# Patient Record
Sex: Female | Born: 1960 | Race: White | Hispanic: No | Smoking: Current every day smoker
Health system: Southern US, Community
[De-identification: ages and names within clinical notes are randomized; demographics above are authoritative.]

## PROBLEM LIST (undated history)

## (undated) DIAGNOSIS — F32A Depression, unspecified: Secondary | ICD-10-CM

## (undated) DIAGNOSIS — J449 Chronic obstructive pulmonary disease, unspecified: Secondary | ICD-10-CM

## (undated) DIAGNOSIS — F419 Anxiety disorder, unspecified: Secondary | ICD-10-CM

## (undated) DIAGNOSIS — M4800 Spinal stenosis, site unspecified: Secondary | ICD-10-CM

## (undated) DIAGNOSIS — M549 Dorsalgia, unspecified: Secondary | ICD-10-CM

## (undated) DIAGNOSIS — F329 Major depressive disorder, single episode, unspecified: Secondary | ICD-10-CM

## (undated) DIAGNOSIS — E782 Mixed hyperlipidemia: Secondary | ICD-10-CM

## (undated) DIAGNOSIS — K219 Gastro-esophageal reflux disease without esophagitis: Secondary | ICD-10-CM

## (undated) HISTORY — PX: DILATION AND CURETTAGE OF UTERUS: SHX78

## (undated) HISTORY — DX: Anxiety disorder, unspecified: F41.9

## (undated) HISTORY — PX: CHOLECYSTECTOMY: SHX55

## (undated) HISTORY — DX: Chronic obstructive pulmonary disease, unspecified: J44.9

## (undated) HISTORY — DX: Spinal stenosis, site unspecified: M48.00

## (undated) HISTORY — DX: Mixed hyperlipidemia: E78.2

## (undated) HISTORY — PX: TUBAL LIGATION: SHX77

---
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
## 2015-10-09 ENCOUNTER — Other Ambulatory Visit (HOSPITAL_COMMUNITY): Payer: Self-pay | Admitting: Adult Health Nurse Practitioner

## 2015-10-09 ENCOUNTER — Ambulatory Visit (HOSPITAL_COMMUNITY)
Admission: RE | Admit: 2015-10-09 | Discharge: 2015-10-09 | Disposition: A | Discharge status: Home / Self Care | Payer: 59 | Source: Ambulatory Visit | Attending: Adult Health Nurse Practitioner | Admitting: Adult Health Nurse Practitioner

## 2015-10-09 DIAGNOSIS — M5441 Lumbago with sciatica, right side: Secondary | ICD-10-CM

## 2015-10-09 DIAGNOSIS — M5442 Lumbago with sciatica, left side: Principal | ICD-10-CM

## 2015-10-19 ENCOUNTER — Other Ambulatory Visit (HOSPITAL_COMMUNITY): Payer: Self-pay | Admitting: Adult Health Nurse Practitioner

## 2015-10-19 DIAGNOSIS — M4310 Spondylolisthesis, site unspecified: Secondary | ICD-10-CM

## 2015-11-01 ENCOUNTER — Ambulatory Visit (HOSPITAL_COMMUNITY)
Admission: RE | Admit: 2015-11-01 | Discharge: 2015-11-01 | Disposition: A | Discharge status: Home / Self Care | Payer: 59 | Source: Ambulatory Visit | Attending: Adult Health Nurse Practitioner | Admitting: Adult Health Nurse Practitioner

## 2015-11-01 DIAGNOSIS — M4807 Spinal stenosis, lumbosacral region: Secondary | ICD-10-CM | POA: Diagnosis not present

## 2015-11-01 DIAGNOSIS — M4310 Spondylolisthesis, site unspecified: Secondary | ICD-10-CM | POA: Diagnosis not present

## 2015-11-01 DIAGNOSIS — M5126 Other intervertebral disc displacement, lumbar region: Secondary | ICD-10-CM | POA: Insufficient documentation

## 2015-11-01 DIAGNOSIS — M5127 Other intervertebral disc displacement, lumbosacral region: Secondary | ICD-10-CM | POA: Diagnosis not present

## 2015-11-28 ENCOUNTER — Ambulatory Visit: Payer: BLUE CROSS/BLUE SHIELD | Attending: Adult Health Nurse Practitioner | Admitting: Physical Therapy

## 2015-11-28 DIAGNOSIS — M5442 Lumbago with sciatica, left side: Secondary | ICD-10-CM | POA: Diagnosis present

## 2015-11-28 DIAGNOSIS — M5441 Lumbago with sciatica, right side: Secondary | ICD-10-CM | POA: Insufficient documentation

## 2015-11-28 NOTE — Therapy (Signed)
[facility] [place], [place], [address] Phone: [phone]   Fax:  [phone]  Physical Therapy Treatment  Patient Details  Name: Lydia Alexander MRN: [id number] Date of Birth: 1961-09-25 Referring Provider: Lars Mage MD.  Encounter Date: 11/28/2015      PT End of Session - 11/28/15 1232    Visit Number 1   Number of Visits 12   Date for PT Re-Evaluation 01/09/16   PT Start Time 0947   PT Stop Time 1033   PT Time Calculation (min) 46 min   Activity Tolerance Patient tolerated treatment well   Behavior During Therapy [facility] for tasks assessed/performed      No past medical history on file.  No past surgical history on file.  There were no vitals filed for this visit.  Visit Diagnosis:  Bilateral low back pain with sciatica, sciatica laterality unspecified - Plan: PT plan of care cert/re-cert, PT plan of care cert/re-cert      Subjective Assessment - 11/28/15 1244    Subjective Began getting right leg pain about a month ago and is now on the left side also.   Limitations Sitting;Standing   How long can you sit comfortably? 10-15 minutes.   How long can you stand comfortably? 10-15 minutes.   Patient Stated Goals Get rid of my leg pain.   Currently in Pain? Yes   Pain Score 6    Pain Location Leg   Pain Orientation Right;Left   Pain Descriptors / Indicators Aching;Throbbing   Pain Onset More than a month ago   Pain Frequency Constant   Aggravating Factors  Sitting, standing and work activities.   Pain Relieving Factors 'Nothing."            [facility] PT Assessment - 11/28/15 0001    Assessment   Medical Diagnosis Lumbar disc bulge.   Referring Provider Lars Mage MD.   Onset Date/Surgical Date --  One month.   Precautions   Precautions None   Restrictions   Weight Bearing Restrictions No   Balance Screen   Has the patient fallen in the past 6 months No   Has the patient had a decrease  in activity level because of a fear of falling?  Yes   Is the patient reluctant to leave their home because of a fear of falling?  No   Home [employer] residence   Prior Function   Level of Independence Independent   Posture/Postural Control   Posture/Postural Control No significant limitations   ROM / Strength   AROM / PROM / Strength AROM;Strength   AROM   Overall AROM Comments Active lumbar flexion limited by 80% and lumbar extenson= 0 degrees.   Strength   Overall Strength Comments Normal LE strength.   Palpation   Palpation comment No palpable low back pain.   Special Tests    Special Tests --  (+) bil SLR's.  0+/4+ bil LE DTR's.   Ambulation/Gait   Gait Comments Transitory movements such as sit to stand and supine to sit are quite painful.  The patient is ambulating in some spinal flexion due to pain.                     [facility] Adult PT Treatment/Exercise - 11/28/15 0001    Modalities   Modalities Electrical Stimulation;Moist Heat   Moist Heat Therapy   Number Minutes Moist Heat 15 Minutes   Electrical Stimulation   Electrical Stimulation  Location --  Lumbar spine.   Electrical Stimulation Action IFC   Electrical Stimulation Parameters 80-150 HZ   Electrical Stimulation Goals Pain                PT Education - 11/28/15 1232    Education provided Yes   Education Details Patient instructed in draw-in exercise and standing extension.   Person(s) Educated Patient   Methods Handout   Comprehension Verbalized understanding;Need further instruction             PT Long Term Goals - 11/28/15 1309    PT LONG TERM GOAL #1   Title Ind with HEP.   Time 6   Period Weeks   Status New   PT LONG TERM GOAL #2   Title Sit 30 minutes with pain not > 3/10.   PT LONG TERM GOAL #3   Title Stand 30 minutes wiht pain not > 3/10.   PT LONG TERM GOAL #4   Title Perform work activiities with pain not > 3/10.   Time 6   Period  Weeks   Status New   PT LONG TERM GOAL #5   Title Eliminate LE pain.   Time 6   Period Weeks   Status New               Plan - 11/28/15 1234    Clinical Impression Statement The patient reports last September after a long drive to [place] she began to experienence low back pain.  She also works 2 jobs and stands and is required to do a lot of bending.  She reports her pain began to radiate into her left buttock and down the back of her left leg to the level of her knee approximately one month ago.  She states she has overcompensated by weight shifting to her right side and is noe experiencing pain down her right LE.  Her pain at rest today is a 5-6/10 but she has had several occasions in which her pain has risen to 10/10 with prolonged standing, sitting and working activities. An MRI revealed multi-level disc bulges.   Pt will benefit from skilled therapeutic intervention in order to improve on the following deficits Pain;Decreased range of motion;Decreased activity tolerance   Rehab Potential Good   PT Frequency 2x / week   PT Duration 6 weeks   PT Treatment/Interventions ADLs/Self Care Home Management;Electrical Stimulation;Moist Heat;Therapeutic exercise;Therapeutic activities;Patient/family education;Manual techniques;Traction   PT Next Visit Plan Can begin int lumbar traction at 40% body weight.  Review draw-in exercise and standing extension.  Progress with core exercises.  Moist heat and electrical stimulation to lower back region.   Consulted and Agree with Plan of Care Patient        Problem List There are no active problems to display for this patient.   Kelleen Stolze, [place] MPT 11/28/2015, 1:32 PM  [facility] [address] [place], [place], [address] Phone: [phone]   Fax:  [phone]  Name: Griselda Bramblett MRN: [id number] Date of Birth: [birth year]

## 2015-12-04 ENCOUNTER — Ambulatory Visit: Payer: BLUE CROSS/BLUE SHIELD | Admitting: Physical Therapy

## 2015-12-06 ENCOUNTER — Ambulatory Visit: Payer: BLUE CROSS/BLUE SHIELD | Admitting: Physical Therapy

## 2015-12-06 DIAGNOSIS — M5441 Lumbago with sciatica, right side: Secondary | ICD-10-CM

## 2015-12-06 DIAGNOSIS — M5442 Lumbago with sciatica, left side: Principal | ICD-10-CM

## 2015-12-06 NOTE — Patient Instructions (Signed)
Pelvic Tilt: Posterior - Legs Bent (Supine)   Tighten stomach and flatten back by rolling pelvis down. Hold _10___ seconds. Relax. Repeat _10-30___ times per set. Do __2__ sets per session. Do _2___ sessions per day.   . Straight Leg Raise   Tighten stomach and slowly raise locked right leg __4__ inches from floor. Repeat __10-30__ times per set. Do __2__ sets per session. Do __2__ sessions per day.    Self-Mobilization: Heel Slide (Supine)   Bent Leg Lift (Hook-Lying)   Tighten stomach and slowly raise right leg _5___ inches from floor. Keep trunk rigid. Hold _3___ seconds. Repeat _10___ times per set. Do ___2-3_ sets per session. Do __2__ sessions per day.

## 2015-12-06 NOTE — Therapy (Signed)
[facility] [place], [place], [address] Phone: [phone]   Fax:  [phone]  Physical Therapy Treatment  Patient Details  Name: Lydia Alexander MRN: [id number] Date of Birth: 1961-07-26 Referring Provider: Lars Mage MD.  Encounter Date: 12/06/2015      PT End of Session - 12/06/15 0959    Visit Number 2   Number of Visits 12   Date for PT Re-Evaluation 01/09/16   PT Start Time 0915   PT Stop Time 0959   PT Time Calculation (min) 44 min   Activity Tolerance Patient tolerated treatment well   Behavior During Therapy [facility] for tasks assessed/performed      No past medical history on file.  No past surgical history on file.  There were no vitals filed for this visit.  Visit Diagnosis:  Bilateral low back pain with sciatica, sciatica laterality unspecified      Subjective Assessment - 12/06/15 0944    Subjective 8/10 pain left low back and down left LE today   Limitations Sitting;Standing   How long can you sit comfortably? 10-15 minutes.   How long can you stand comfortably? 10-15 minutes.   Patient Stated Goals Get rid of my leg pain.   Currently in Pain? Yes   Pain Score 8    Pain Location Back   Pain Orientation Left;Right   Pain Descriptors / Indicators Aching;Burning   Pain Type Acute pain   Pain Onset More than a month ago   Pain Frequency Constant   Aggravating Factors  increased activity in any one position   Pain Relieving Factors nothing at this time                          [facility] Adult PT Treatment/Exercise - 12/06/15 0001    Exercises   Exercises Lumbar   Lumbar Exercises: Supine   Ab Set --  draw ins 2x10 with holds   Bent Knee Raise --  draw ins 2x10 with holds   Straight Leg Raise --  draw ins 2x10 with holds   Modalities   Modalities Traction   Moist Heat Therapy   Number Minutes Moist Heat 15 Minutes   Electrical Stimulation   Electrical Stimulation  Location back   Electrical Stimulation Action IFC   Electrical Stimulation Parameters 80-150hz    Electrical Stimulation Goals Pain   Traction   Type of Traction Lumbar   Min (lbs) 5   Max (lbs) 84   Hold Time 99   Rest Time 5   Time 15                PT Education - 12/06/15 0947    Education provided Yes   Education Details HEP   Person(s) Educated Patient   Methods Explanation;Demonstration;Handout   Comprehension Verbalized understanding;Returned demonstration             PT Long Term Goals - 12/06/15 1008    PT LONG TERM GOAL #1   Title Ind with HEP.   Time 6   Period Weeks   Status On-going   PT LONG TERM GOAL #2   Title Sit 30 minutes with pain not > 3/10.   Time 6   Period Weeks   Status On-going   PT LONG TERM GOAL #3   Title Stand 30 minutes wiht pain not > 3/10.   Time 6   Period Weeks   Status On-going   PT LONG TERM GOAL #  4   Title Perform work activiities with pain not > 3/10.   Time 6   Period Weeks   Status On-going   PT LONG TERM GOAL #5   Title Eliminate LE pain.   Time 6   Period Weeks   Status On-going               Plan - 12/06/15 1001    Clinical Impression Statement Patient tolerated treatment with no pain increase. Patient started traction (40% traction at 84#) per MPT, Patient was given HEP for beginning draw in core exercises with good understanding. Patient unable to meet LTG's due to pain deficits.   Pt will benefit from skilled therapeutic intervention in order to improve on the following deficits Pain;Decreased range of motion;Decreased activity tolerance   Rehab Potential Good   Clinical Impairments Affecting Rehab Potential Patient weight 210#    PT Frequency 2x / week   PT Duration 6 weeks   PT Treatment/Interventions ADLs/Self Care Home Management;Electrical Stimulation;Moist Heat;Therapeutic exercise;Therapeutic activities;Patient/family education;Manual techniques;Traction   PT Next Visit Plan cont with  POC per MPT- traction, standing ext and core exercises per patient tolerance   Consulted and Agree with Plan of Care Patient        Problem List There are no active problems to display for this patient.   Phillips Climes, PTA 12/06/2015, 10:17 AM  [facility] [place], [place], [address] Phone: [phone]   Fax:  [phone]  Name: Lydia Alexander MRN: [id number] Date of Birth: March 09, 1961

## 2015-12-11 ENCOUNTER — Ambulatory Visit: Payer: BLUE CROSS/BLUE SHIELD | Admitting: Physical Therapy

## 2015-12-11 DIAGNOSIS — M5441 Lumbago with sciatica, right side: Secondary | ICD-10-CM | POA: Diagnosis not present

## 2015-12-11 DIAGNOSIS — M5442 Lumbago with sciatica, left side: Principal | ICD-10-CM

## 2015-12-11 NOTE — Therapy (Signed)
[facility] [place], [place], [address] Phone: [phone]   Fax:  [phone]  Physical Therapy Treatment  Patient Details  Name: Lydia Alexander MRN: [id number] Date of Birth: February 01, 1961 Referring Provider: Lars Mage MD.  Encounter Date: 12/11/2015      PT End of Session - 12/11/15 1348    Visit Number 3   Number of Visits 12   Date for PT Re-Evaluation 01/09/16   PT Start Time 6948   PT Stop Time 1436   PT Time Calculation (min) 48 min   Activity Tolerance Patient tolerated treatment well   Behavior During Therapy [facility] for tasks assessed/performed      No past medical history on file.  No past surgical history on file.  There were no vitals filed for this visit.  Visit Diagnosis:  Bilateral low back pain with sciatica, sciatica laterality unspecified      Subjective Assessment - 12/11/15 1347    Subjective Reports that she just got off work and states that she took her flexeril secondary to leg tightness.   Limitations Sitting;Standing   How long can you sit comfortably? 10-15 minutes.   How long can you stand comfortably? 10-15 minutes.   Patient Stated Goals Get rid of my leg pain.   Currently in Pain? Yes   Pain Score 5    Pain Location Leg   Pain Orientation Right;Left   Pain Descriptors / Indicators Tightness   Pain Type Acute pain   Pain Onset More than a month ago            [facility] PT Assessment - 12/11/15 0001    Assessment   Medical Diagnosis Lumbar disc bulge.   Next MD Visit none scheduled   Precautions   Precautions None                     [facility] Adult PT Treatment/Exercise - 12/11/15 0001    Lumbar Exercises: Standing   Other Standing Lumbar Exercises Rockerboard x3 min for calf tightness   Lumbar Exercises: Supine   Ab Set 20 reps;5 seconds   Clam 20 reps   Heel Slides Other (comment);10 reps  RLE shooting pain noted; "like a rubberband"   Bent Knee Raise  20 reps;Other (comment)  BLE   Bridge 20 reps;Other (comment)  "a little bit but not too bad" pain   Straight Leg Raise 20 reps;Other (comment)  BLE; Reported "a little" RLE pain   Other Supine Lumbar Exercises B supine leg press with core activation x20 reps   B thigh tingling experienced   Modalities   Modalities Traction   Traction   Type of Traction Lumbar   Min (lbs) 5   Max (lbs) 84   Hold Time 99   Rest Time 5   Time 15                     PT Long Term Goals - 12/06/15 1008    PT LONG TERM GOAL #1   Title Ind with HEP.   Time 6   Period Weeks   Status On-going   PT LONG TERM GOAL #2   Title Sit 30 minutes with pain not > 3/10.   Time 6   Period Weeks   Status On-going   PT LONG TERM GOAL #3   Title Stand 30 minutes wiht pain not > 3/10.   Time 6   Period Weeks   Status On-going  PT LONG TERM GOAL #4   Title Perform work activiities with pain not > 3/10.   Time 6   Period Weeks   Status On-going   PT LONG TERM GOAL #5   Title Eliminate LE pain.   Time 6   Period Weeks   Status On-going               Plan - 12/11/15 1420    Clinical Impression Statement Patient tolerated today's treatment well without increase in low back pain and only had complaints of sensations in B thighs with exercise. Patient unable to meet any goals today secondary to pain experienced and reported inability to complete HEP secondary to busy work schedule as well as continued LE symptoms. Completed all supine core exercises today with core activation direction for core strengthening with only intermittnat reports of sensations such as tingling with B leg press and heel slides limited to RLE only secondary to RLE pain experienced. Patient experiences pain with transitional movements only intermittantly now per patient report but now pain mostly in RLE secondary to compensatory strategies per patient report. Patient also reported increased edema in R foot upon waking and  patient was educated to elevate B feet following work or before bed to prevent edema. Traction continued with max weight at 84# today with normal response upon end of traction session.   Pt will benefit from skilled therapeutic intervention in order to improve on the following deficits Pain;Decreased range of motion;Decreased activity tolerance   Rehab Potential Good   Clinical Impairments Affecting Rehab Potential Patient weight 210#    PT Frequency 2x / week   PT Duration 6 weeks   PT Treatment/Interventions ADLs/Self Care Home Management;Electrical Stimulation;Moist Heat;Therapeutic exercise;Therapeutic activities;Patient/family education;Manual techniques;Traction   PT Next Visit Plan cont with POC per MPT- traction, standing ext and core exercises per patient tolerance   Consulted and Agree with Plan of Care Patient        Problem List There are no active problems to display for this patient.   Wynelle Fanny, PTA 12/11/2015, 3:10 PM  [facility] [address] [place], [place], [address] Phone: [phone]   Fax:  [phone]  Name: Lydia Alexander MRN: [id number] Date of Birth: 1961-07-22

## 2015-12-12 ENCOUNTER — Encounter: Payer: Self-pay | Admitting: Physical Therapy

## 2015-12-12 ENCOUNTER — Ambulatory Visit: Payer: BLUE CROSS/BLUE SHIELD | Admitting: Physical Therapy

## 2015-12-12 DIAGNOSIS — M5441 Lumbago with sciatica, right side: Secondary | ICD-10-CM | POA: Diagnosis not present

## 2015-12-12 DIAGNOSIS — M5442 Lumbago with sciatica, left side: Principal | ICD-10-CM

## 2015-12-12 NOTE — Therapy (Signed)
[facility] [place], [place], [address] Phone: [phone]   Fax:  [phone]  Physical Therapy Treatment  Patient Details  Name: Lydia Alexander MRN: [id number] Date of Birth: 04/17/1961 Referring Provider: Lars Mage MD.  Encounter Date: 12/12/2015      PT End of Session - 12/12/15 1514    Visit Number 4   Number of Visits 12   Date for PT Re-Evaluation 01/09/16   PT Start Time 0814   PT Stop Time 1526   PT Time Calculation (min) 40 min   Activity Tolerance Patient tolerated treatment well   Behavior During Therapy [facility] for tasks assessed/performed      History reviewed. No pertinent past medical history.  History reviewed. No pertinent past surgical history.  There were no vitals filed for this visit.  Visit Diagnosis:  Bilateral low back pain with sciatica, sciatica laterality unspecified      Subjective Assessment - 12/12/15 1451    Subjective some improvement overall yet still LE symptoms   Limitations Sitting;Standing   How long can you sit comfortably? 10-15 minutes.   How long can you stand comfortably? 10-15 minutes.   Patient Stated Goals Get rid of my leg pain.   Currently in Pain? Yes   Pain Score 5    Pain Location Leg   Pain Orientation Right;Left;Lower   Pain Descriptors / Indicators Dull;Aching   Pain Type Acute pain   Pain Onset More than a month ago   Pain Frequency Constant   Aggravating Factors  increased activity in one position    Pain Relieving Factors therapy has helped thus far                         [facility] Adult PT Treatment/Exercise - 12/12/15 0001    Lumbar Exercises: Supine   Ab Set 20 reps;5 seconds   Clam 20 reps   Heel Slides --  2x10    Bent Knee Raise Other (comment);3 seconds  2x10   Bridge 3 seconds;20 reps   Straight Leg Raise 3 seconds  2x10   Other Supine Lumbar Exercises seated for core strength with scap retraction with red tband  2x10   Traction   Type of Traction Lumbar   Min (lbs) 5   Max (lbs) 90   Hold Time 99   Rest Time 5   Time 15                     PT Long Term Goals - 12/06/15 1008    PT LONG TERM GOAL #1   Title Ind with HEP.   Time 6   Period Weeks   Status On-going   PT LONG TERM GOAL #2   Title Sit 30 minutes with pain not > 3/10.   Time 6   Period Weeks   Status On-going   PT LONG TERM GOAL #3   Title Stand 30 minutes wiht pain not > 3/10.   Time 6   Period Weeks   Status On-going   PT LONG TERM GOAL #4   Title Perform work activiities with pain not > 3/10.   Time 6   Period Weeks   Status On-going   PT LONG TERM GOAL #5   Title Eliminate LE pain.   Time 6   Period Weeks   Status On-going               Plan - 12/12/15 1516  Clinical Impression Statement Patient has reported some improvement in symptoms and feels like therapy has helped thus far. patenit has not been doing corestrength at home due to fatigue. today increased lumbar traction to 90# MPT. goals ongoing due to ongoing LE symptoms.   Pt will benefit from skilled therapeutic intervention in order to improve on the following deficits Pain;Decreased range of motion;Decreased activity tolerance   Rehab Potential Good   Clinical Impairments Affecting Rehab Potential Patient weight 210#    PT Frequency 2x / week   PT Duration 6 weeks   PT Treatment/Interventions ADLs/Self Care Home Management;Electrical Stimulation;Moist Heat;Therapeutic exercise;Therapeutic activities;Patient/family education;Manual techniques;Traction   PT Next Visit Plan cont with POC per MPT- traction, standing ext and core exercises per patient tolerance   Consulted and Agree with Plan of Care Patient        Problem List There are no active problems to display for this patient.   Phillips Climes, PTA 12/12/2015, 3:26 PM  [facility] [place], [place],  [address] Phone: [phone]   Fax:  [phone]  Name: Lydia Alexander MRN: [id number] Date of Birth: June 13, 1961

## 2015-12-19 ENCOUNTER — Ambulatory Visit: Payer: BLUE CROSS/BLUE SHIELD | Attending: Adult Health Nurse Practitioner | Admitting: Physical Therapy

## 2015-12-19 DIAGNOSIS — M5441 Lumbago with sciatica, right side: Secondary | ICD-10-CM | POA: Insufficient documentation

## 2015-12-19 DIAGNOSIS — M5442 Lumbago with sciatica, left side: Secondary | ICD-10-CM | POA: Diagnosis present

## 2015-12-19 NOTE — Therapy (Signed)
[facility] [place], [place], [address] Phone: [phone]   Fax:  [phone]  Physical Therapy Treatment  Patient Details  Name: Lydia Alexander MRN: [id number] Date of Birth: Aug 06, 1961 Referring Provider: Lars Mage MD.  Encounter Date: 12/19/2015      PT End of Session - 12/19/15 1446    Visit Number 5   Number of Visits 12   Date for PT Re-Evaluation 01/09/16   PT Start Time 9038   PT Stop Time 1527  2 units secondary to patient being 12 minutes late for appt and traction set up difficulty   PT Time Calculation (min) 44 min   Activity Tolerance Patient tolerated treatment well   Behavior During Therapy [facility] for tasks assessed/performed      No past medical history on file.  No past surgical history on file.  There were no vitals filed for this visit.  Visit Diagnosis:  Bilateral low back pain with sciatica, sciatica laterality unspecified      Subjective Assessment - 12/19/15 1443    Subjective Reports that she is having some improvement but had pain in low back following previous treatment when she got home and was relaxing. Was 5/10 low back pain upon waking this morning. 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   Limitations Sitting;Standing   How long can you sit comfortably? 10-15 minutes.   How long can you stand comfortably? 10-15 minutes.   Patient Stated Goals Get rid of my leg pain.   Currently in Pain? Yes   Pain Score 3    Pain Location Leg   Pain Orientation Right;Left   Pain Descriptors / Indicators Sore;Throbbing   Pain Type Acute pain   Pain Onset More than a month ago            [facility] PT Assessment - 12/19/15 0001    Assessment   Medical Diagnosis Lumbar disc bulge.   Next MD Visit none scheduled   Precautions   Precautions None                     [facility] Adult PT Treatment/Exercise - 12/19/15 0001    Lumbar Exercises: Supine   Ab Set 20 reps;5 seconds    Clam 20 reps;Other (comment)  yellow theraband   Heel Slides 20 reps  BLE   Bent Knee Raise 20 reps   Bridge 20 reps  Experienced pull in B anterior thighs   Straight Leg Raise 20 reps;Other (comment)  BLE   Other Supine Lumbar Exercises Seated B shoulder row, ext with core activation and red therband x20 reps each   Modalities   Modalities Traction   Traction   Type of Traction Lumbar   Min (lbs) 5   Max (lbs) 86   Hold Time 99   Rest Time 5   Time 15                     PT Long Term Goals - 12/06/15 1008    PT LONG TERM GOAL #1   Title Ind with HEP.   Time 6   Period Weeks   Status On-going   PT LONG TERM GOAL #2   Title Sit 30 minutes with pain not > 3/10.   Time 6   Period Weeks   Status On-going   PT LONG TERM GOAL #3   Title Stand 30 minutes wiht pain not > 3/10.   Time 6   Period Weeks  Status On-going   PT LONG TERM GOAL #4   Title Perform work activiities with pain not > 3/10.   Time 6   Period Weeks   Status On-going   PT LONG TERM GOAL #5   Title Eliminate LE pain.   Time 6   Period Weeks   Status On-going               Plan - 12/19/15 1515    Clinical Impression Statement Patient continues to tolerate treatment fairly well today although she continues to experience LE symptoms with R> L. Patient experiences LE symptoms down to approximately calf level but notices RLE symptoms more per patient report. Patient at times notices herself leaning to the R side while at rest and is more conscious of her posture now per patient report. Patient also has experienced decreased R foot swelling per patient report. Completed all therapeutic exercises with VCs for core activation and proper lumbar posure. Traction max weight decreased to 86# per patient experiencing discomfort following previous treatment when she returned home.    Pt will benefit from skilled therapeutic intervention in order to improve on the following deficits  Pain;Decreased range of motion;Decreased activity tolerance   Rehab Potential Good   Clinical Impairments Affecting Rehab Potential Patient weight 210#    PT Frequency 2x / week   PT Duration 6 weeks   PT Treatment/Interventions ADLs/Self Care Home Management;Electrical Stimulation;Moist Heat;Therapeutic exercise;Therapeutic activities;Patient/family education;Manual techniques;Traction   PT Next Visit Plan cont with POC per MPT- traction, standing ext and core exercises per patient tolerance. Assess traction response prior to adjusting max weight again.   Consulted and Agree with Plan of Care Patient        Problem List There are no active problems to display for this patient.   Wynelle Fanny, PTA 12/19/2015, 3:58 PM  [facility] [place], [place], [address] Phone: [phone]   Fax:  [phone]  Name: Lydia Alexander MRN: [id number] Date of Birth: [birth year]

## 2015-12-21 ENCOUNTER — Ambulatory Visit: Payer: BLUE CROSS/BLUE SHIELD | Admitting: Physical Therapy

## 2015-12-21 DIAGNOSIS — M5442 Lumbago with sciatica, left side: Principal | ICD-10-CM

## 2015-12-21 DIAGNOSIS — M5441 Lumbago with sciatica, right side: Secondary | ICD-10-CM | POA: Diagnosis not present

## 2015-12-21 NOTE — Therapy (Signed)
[facility] [place], [place], [address] Phone: [phone]   Fax:  [phone]  Physical Therapy Treatment  Patient Details  Name: Lydia Alexander MRN: [id number] Date of Birth: 24-Nov-1960 Referring Provider: Lars Mage MD.  Encounter Date: 12/21/2015      PT End of Session - 12/21/15 1443    Visit Number 6   Number of Visits 12   Date for PT Re-Evaluation 01/09/16   PT Start Time 1950   PT Stop Time 1533   PT Time Calculation (min) 50 min   Activity Tolerance Patient tolerated treatment well   Behavior During Therapy [facility] for tasks assessed/performed      No past medical history on file.  No past surgical history on file.  There were no vitals filed for this visit.  Visit Diagnosis:  Bilateral low back pain with sciatica, sciatica laterality unspecified      Subjective Assessment - 12/21/15 1442    Subjective States that she is tired from work. Reports pain is now from her knees down compared to before therapy where pain was from her hips down.   Limitations Sitting;Standing   How long can you sit comfortably? 10-15 minutes.   How long can you stand comfortably? 10-15 minutes.   Patient Stated Goals Get rid of my leg pain.   Currently in Pain? Yes   Pain Score 5    Pain Location Leg   Pain Orientation Right;Left   Pain Descriptors / Indicators Throbbing   Pain Type Acute pain   Pain Onset More than a month ago            [facility] PT Assessment - 12/21/15 0001    Assessment   Medical Diagnosis Lumbar disc bulge.   Next MD Visit none scheduled   Precautions   Precautions None                     [facility] Adult PT Treatment/Exercise - 12/21/15 0001    Lumbar Exercises: Standing   Row Strengthening;Both  3x10 reps with Pink XTS   Shoulder Extension Strengthening;Both  3x10 reps with Pink XTS   Lumbar Exercises: Supine   Ab Set 20 reps;5 seconds   Heel Slides 20 reps  BLE   Bent  Knee Raise 20 reps  red theraband   Bridge 20 reps   Straight Leg Raise 20 reps  BLE   Lumbar Exercises: Sidelying   Clam 20 reps  BLE with red theraband   Modalities   Modalities Traction   Traction   Type of Traction Lumbar   Min (lbs) 5   Max (lbs) 86   Hold Time 99   Rest Time 5   Time 15                     PT Long Term Goals - 12/06/15 1008    PT LONG TERM GOAL #1   Title Ind with HEP.   Time 6   Period Weeks   Status On-going   PT LONG TERM GOAL #2   Title Sit 30 minutes with pain not > 3/10.   Time 6   Period Weeks   Status On-going   PT LONG TERM GOAL #3   Title Stand 30 minutes wiht pain not > 3/10.   Time 6   Period Weeks   Status On-going   PT LONG TERM GOAL #4   Title Perform work activiities with pain not > 3/10.  Time 6   Period Weeks   Status On-going   PT LONG TERM GOAL #5   Title Eliminate LE pain.   Time 6   Period Weeks   Status On-going               Plan - 12/21/15 1554    Clinical Impression Statement Patient tolerated today's treatment fairly well although she arrived to today's treatment reporting BLE throbbing but stated she worked last night and went back to work early this morning. With both jobs patient has oppurtunities to sit if needed per patient report. Patient has seen improvement in the fact that pain was previously in B hips down and was unbearable as to now her pain is tolerable from B knees down. Patient completes all exercises with VCs for core activation to improve core strength and lumbar stabilization. Patient experienced RLE numbness with standing shoulder extension with Pink XTS. Traction was completed yet at again at 86# max today. Patient still felt slight discomfort following previous treatment but rested following return home per patient report.   Pt will benefit from skilled therapeutic intervention in order to improve on the following deficits Pain;Decreased range of motion;Decreased activity  tolerance   Rehab Potential Good   Clinical Impairments Affecting Rehab Potential Patient weight 210#    PT Frequency 2x / week   PT Duration 6 weeks   PT Treatment/Interventions ADLs/Self Care Home Management;Electrical Stimulation;Moist Heat;Therapeutic exercise;Therapeutic activities;Patient/family education;Manual techniques;Traction   PT Next Visit Plan cont with POC per MPT- traction, standing ext and core exercises per patient tolerance. Assess traction response prior to adjusting max weight again.   Consulted and Agree with Plan of Care Patient        Problem List There are no active problems to display for this patient.   Wynelle Fanny, PTA 12/21/2015, 4:00 PM  [facility] [address] [place], [place], [address] Phone: [phone]   Fax:  [phone]  Name: Lydia Alexander MRN: [id number] Date of Birth: [birth year]

## 2015-12-26 ENCOUNTER — Ambulatory Visit: Payer: BLUE CROSS/BLUE SHIELD | Admitting: Physical Therapy

## 2015-12-26 DIAGNOSIS — M5441 Lumbago with sciatica, right side: Secondary | ICD-10-CM | POA: Diagnosis not present

## 2015-12-26 DIAGNOSIS — M5442 Lumbago with sciatica, left side: Principal | ICD-10-CM

## 2015-12-26 NOTE — Therapy (Signed)
[facility] [place], [place], [address] Phone: [phone]   Fax:  [phone]  Physical Therapy Treatment  Patient Details  Name: Lydia Alexander MRN: [id number] Date of Birth: 02/24/1961 Referring Provider: Lars Mage MD.  Encounter Date: 12/26/2015      PT End of Session - 12/26/15 1434    Visit Number 7   Number of Visits 12   Date for PT Re-Evaluation 01/09/16   PT Start Time 1435   PT Stop Time 1523   PT Time Calculation (min) 48 min      No past medical history on file.  No past surgical history on file.  There were no vitals filed for this visit.  Visit Diagnosis:  Bilateral low back pain with sciatica, sciatica laterality unspecified      Subjective Assessment - 12/26/15 1432    Subjective Reports that she slept after Thursdays appt from afternoon until the next day. States that she usually has pain following treatments. States that she hasn't had much pain since previous treatment.   Limitations Sitting;Standing   How long can you sit comfortably? 10-15 minutes.   How long can you stand comfortably? 10-15 minutes.   Patient Stated Goals Get rid of my leg pain.   Currently in Pain? Yes   Pain Score 3    Pain Location Leg   Pain Orientation Right;Left   Pain Descriptors / Indicators Throbbing   Pain Type Acute pain   Pain Onset More than a month ago            [facility] PT Assessment - 12/26/15 0001    Assessment   Medical Diagnosis Lumbar disc bulge.   Next MD Visit none scheduled   Precautions   Precautions None                     [facility] Adult PT Treatment/Exercise - 12/26/15 0001    Lumbar Exercises: Standing   Row Strengthening;Both  3x10 reps Pink XTS   Shoulder Extension Strengthening;Both  3x10 reps Pink XTS   Other Standing Lumbar Exercises B hip extension x20 reps each   Lumbar Exercises: Supine   Ab Set 5 seconds;Other (comment)  3x10 reps; LE tingling  following exercise   Heel Slides Other (comment)  3x10 reps  BLE; RLE minimal pain   Bent Knee Raise Other (comment)  BLE 3x10 reps each   Bridge Other (comment)  3x10 reps   Straight Leg Raise Other (comment)  3x10 reps BLE   Lumbar Exercises: Sidelying   Clam Other (comment)  3x10 reps BLE with red therband   Modalities   Modalities Traction   Traction   Type of Traction Lumbar   Min (lbs) 5   Max (lbs) 86   Hold Time 99   Rest Time 5   Time 15                     PT Long Term Goals - 12/06/15 1008    PT LONG TERM GOAL #1   Title Ind with HEP.   Time 6   Period Weeks   Status On-going   PT LONG TERM GOAL #2   Title Sit 30 minutes with pain not > 3/10.   Time 6   Period Weeks   Status On-going   PT LONG TERM GOAL #3   Title Stand 30 minutes wiht pain not > 3/10.   Time 6   Period Weeks   Status  On-going   PT LONG TERM GOAL #4   Title Perform work activiities with pain not > 3/10.   Time 6   Period Weeks   Status On-going   PT LONG TERM GOAL #5   Title Eliminate LE pain.   Time 6   Period Weeks   Status On-going               Plan - 12/26/15 1510    Clinical Impression Statement Patient tolerated today's treatment fairly well although she has seen an approximate 35-40% improvement. Patient experienced decreased LE pain upon arrival today and only experienced minimal tingling or sensation with exercises today. Completed all exercises with VCs for core activation and strengthening. Tolerated standing shoulder rows and extension well today but with initiation of B hip extension patient experienced RLE throbbing. Traction again maintained max at 86# and may be advanced in the future secondary to LE symptom status. Patient did not verbalize any lumbar pain or LE pain if pain was present following today's treatment.   Pt will benefit from skilled therapeutic intervention in order to improve on the following deficits Pain;Decreased range of  motion;Decreased activity tolerance   Rehab Potential Good   Clinical Impairments Affecting Rehab Potential Patient weight 210#    PT Frequency 2x / week   PT Duration 6 weeks   PT Treatment/Interventions ADLs/Self Care Home Management;Electrical Stimulation;Moist Heat;Therapeutic exercise;Therapeutic activities;Patient/family education;Manual techniques;Traction   PT Next Visit Plan cont with POC per MPT- traction, standing ext and core exercises per patient tolerance. Assess traction response prior to adjusting max weight again.   Consulted and Agree with Plan of Care Patient        Problem List There are no active problems to display for this patient.   Wynelle Fanny, PTA 12/26/2015, 3:29 PM  [facility] [place], [place], [address] Phone: [phone]   Fax:  [phone]  Name: Lydia Alexander MRN: [id number] Date of Birth: February 17, 1961

## 2015-12-29 ENCOUNTER — Ambulatory Visit: Payer: BLUE CROSS/BLUE SHIELD | Admitting: Physical Therapy

## 2015-12-29 DIAGNOSIS — M5441 Lumbago with sciatica, right side: Secondary | ICD-10-CM

## 2015-12-29 DIAGNOSIS — M5442 Lumbago with sciatica, left side: Principal | ICD-10-CM

## 2015-12-29 NOTE — Therapy (Signed)
[facility] [place], [place], [address] Phone: [phone]   Fax:  [phone]  Physical Therapy Treatment  Patient Details  Name: Lydia Alexander MRN: [id number] Date of Birth: 1961-07-27 Referring Provider: Lars Mage MD.  Encounter Date: 12/29/2015      PT End of Session - 12/29/15 1049    Visit Number 8   Number of Visits 12   Date for PT Re-Evaluation 01/09/16   PT Start Time 1039   PT Stop Time 1137   PT Time Calculation (min) 58 min   Activity Tolerance Patient tolerated treatment well   Behavior During Therapy [facility] for tasks assessed/performed      No past medical history on file.  No past surgical history on file.  There were no vitals filed for this visit.  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   Limitations Sitting;Standing   How long can you sit comfortably? 10-15 minutes.   How long can you stand comfortably? 10-15 minutes.   Patient Stated Goals Get rid of my leg pain.   Currently in Pain? Yes   Pain Score 4    Pain Orientation Left   Pain Descriptors / Indicators Throbbing   Pain Type Acute pain   Pain Onset More than a month ago   Pain Frequency Constant   Aggravating Factors  increased activity                         [facility] Adult PT Treatment/Exercise - 12/29/15 0001    Lumbar Exercises: Aerobic   Stationary Bike Nustep L 4 x 10   Lumbar Exercises: Prone   Straight Leg Raise 20 reps   Opposite Arm/Leg Raise 20 reps   Other Prone Lumbar Exercises POE abolishes pain, but starts numbness/tingling   Other Prone Lumbar Exercises prone lying gets rid of NT and   Traction   Type of Traction Lumbar   Min (lbs) 5   Max  (lbs) 86   Hold Time 99   Rest Time 5   Time 15                PT Education - 12/29/15 1218    Education provided Yes   Education Details HEP   Person(s) Educated Patient   Methods Explanation;Demonstration;Handout   Comprehension Verbalized understanding;Returned demonstration             PT Long Term Goals - 12/06/15 1008    PT LONG TERM GOAL #1   Title Ind with HEP.   Time 6   Period Weeks   Status On-going   PT LONG TERM GOAL #2   Title Sit 30 minutes with pain not > 3/10.   Time 6   Period Weeks   Status On-going   PT LONG TERM GOAL #3   Title Stand 30 minutes wiht pain not > 3/10.   Time 6   Period Weeks   Status On-going   PT LONG TERM GOAL #4   Title Perform work activiities with pain not > 3/10.   Time 6   Period Weeks   Status On-going   PT LONG TERM GOAL #5   Title Eliminate LE pain.   Time 6  Period Weeks   Status On-going               Plan - 12/29/15 1219    Clinical Impression Statement Patient reported increased pain at start of treatment but was able to abolish pain with prone lying. Patient encouraged to lie prone every two hours x 5-10 min when possible to try to centralize sx. She had some pain with isolated Multifidus strengthening in prone, but no pain with alt opp arm and leg. She responded normally to lumbar traction. No goals met at this time.   PT Next Visit Plan Assess traction. Cont core strengthening and progressing prone extension as tolerated.   PT Home Exercise Plan prone opp arm/leg; prone lying   Consulted and Agree with Plan of Care Patient        Problem List There are no active problems to display for this patient.   Madelyn Flavors PT  12/29/2015, 12:29 PM  [facility] [place], [place], [address] Phone: [phone]   Fax:  [phone]  Name: Lydia Alexander MRN: [id number] Date of Birth: Nov 22, 1960

## 2016-01-02 ENCOUNTER — Encounter: Payer: BLUE CROSS/BLUE SHIELD | Admitting: Physical Therapy

## 2016-01-04 ENCOUNTER — Encounter: Payer: Self-pay | Admitting: Physical Therapy

## 2016-01-04 ENCOUNTER — Ambulatory Visit: Payer: BLUE CROSS/BLUE SHIELD | Admitting: Physical Therapy

## 2016-01-04 DIAGNOSIS — M5441 Lumbago with sciatica, right side: Secondary | ICD-10-CM | POA: Diagnosis not present

## 2016-01-04 DIAGNOSIS — M5442 Lumbago with sciatica, left side: Principal | ICD-10-CM

## 2016-01-04 NOTE — Therapy (Signed)
[facility] [place], [place], [address] Phone: [phone]   Fax:  [phone]  Physical Therapy Treatment  Patient Details  Name: Lydia Alexander MRN: [id number] Date of Birth: 1961-01-08 Referring Provider: Lars Mage MD.  Encounter Date: 01/04/2016      PT End of Session - 01/04/16 1449    Visit Number 9   Number of Visits 12   Date for PT Re-Evaluation 01/09/16   PT Start Time 1440   PT Stop Time 1525   PT Time Calculation (min) 45 min   Activity Tolerance Patient tolerated treatment well   Behavior During Therapy [facility] for tasks assessed/performed      History reviewed. No pertinent past medical history.  History reviewed. No pertinent past surgical history.  There were no vitals filed for this visit.  Visit Diagnosis:  Bilateral low back pain with sciatica, sciatica laterality unspecified      Subjective Assessment - 01/04/16 1444    Subjective Patient feeling a little sick from her medication from abscess tooth   Limitations Sitting;Standing   How long can you sit comfortably? 10-15 minutes.   How long can you stand comfortably? 10-15 minutes.   Patient Stated Goals Get rid of my leg pain.   Currently in Pain? Yes   Pain Score 4    Pain Location Leg   Pain Orientation Left   Pain Descriptors / Indicators Throbbing   Pain Type Acute pain   Pain Onset More than a month ago   Pain Frequency Intermittent   Aggravating Factors  work or increased activity   Pain Relieving Factors rest and therapy                         [facility] Adult PT Treatment/Exercise - 01/04/16 0001    Lumbar Exercises: Aerobic   Stationary Bike Nustep L 4 x 10, posture and core activation focus   Lumbar Exercises: Standing   Row Strengthening;Both  draw in focus with pink XTS 2x10   Shoulder Extension Strengthening;Both  draw in focus 2x10 with pink XTS   Lumbar Exercises: Supine   Ab Set 20 reps  10 sec  holds   Bent Knee Raise 3 seconds  2x10 each LE   Bridge 3 seconds  3x10   Straight Leg Raise 3 seconds  2x10 each LE   Traction   Type of Traction Lumbar   Min (lbs) 5   Max (lbs) 86   Hold Time 99   Rest Time 5   Time 15                     PT Long Term Goals - 01/04/16 1500    PT LONG TERM GOAL #1   Title Ind with HEP.   Time 6   Period Weeks   Status Achieved  01/04/16   PT LONG TERM GOAL #2   Title Sit 30 minutes with pain not > 3/10.   Time 6   Period Weeks   Status On-going  5-6/10 (01/04/16/)   PT LONG TERM GOAL #3   Title Stand 30 minutes wiht pain not > 3/10.   Time 6   Period Weeks   Status On-going   PT LONG TERM GOAL #4   Title Perform work activiities with pain not > 3/10.   Time 6   Period Weeks   Status On-going  4/10 (01/04/16)   PT LONG TERM GOAL #5  Title Eliminate LE pain.   Time 6   Period Weeks   Status On-going  reports 45% LE symptoms remain (01/04/16)               Plan - 01/04/16 1508    Clinical Impression Statement Patient has continued to progress and respond well to therapy. Patient reported feling 70% better overall. Patient is able to tolerate sitting and standing for longer with less pain yet not at goal status at this time. Patient has 45% remaining symptoms in LE patient is independent with HEP's and met LTG #1 other goals ongoing due to pain and LE symptoms.    Pt will benefit from skilled therapeutic intervention in order to improve on the following deficits Pain;Decreased range of motion;Decreased activity tolerance   Rehab Potential Good   Clinical Impairments Affecting Rehab Potential Patient weight 210#    PT Frequency 2x / week   PT Duration 6 weeks   PT Treatment/Interventions ADLs/Self Care Home Management;Electrical Stimulation;Moist Heat;Therapeutic exercise;Therapeutic activities;Patient/family education;Manual techniques;Traction   PT Next Visit Plan cont with traction at same level and core  strengthening and progressing prone extension as tolerated.   Consulted and Agree with Plan of Care Patient        Problem List There are no active problems to display for this patient.   Phillips Climes, PTA 01/04/2016, 3:26 PM  [facility] [place], [place], [address] Phone: [phone]   Fax:  [phone]  Name: Lydia Alexander MRN: [id number] Date of Birth: 07-19-1961

## 2016-01-09 ENCOUNTER — Ambulatory Visit: Payer: BLUE CROSS/BLUE SHIELD | Admitting: Physical Therapy

## 2016-01-09 VITALS — BP 129/84 | HR 93

## 2016-01-09 DIAGNOSIS — M5442 Lumbago with sciatica, left side: Principal | ICD-10-CM

## 2016-01-09 DIAGNOSIS — M5441 Lumbago with sciatica, right side: Secondary | ICD-10-CM | POA: Diagnosis not present

## 2016-01-09 NOTE — Therapy (Signed)
[facility] [place], [place], [address] Phone: [phone]   Fax:  [phone]  Physical Therapy Treatment  Patient Details  Name: Lydia Alexander MRN: [id number] Date of Birth: 27-Sep-1961 Referring Provider: Lars Mage MD.  Encounter Date: 01/09/2016      PT End of Session - 01/09/16 1510    Visit Number 10   Number of Visits 12   Date for PT Re-Evaluation 01/09/16   PT Start Time 1518   PT Stop Time 1609   PT Time Calculation (min) 51 min   Activity Tolerance Patient tolerated treatment well   Behavior During Therapy [facility] for tasks assessed/performed      No past medical history on file.  No past surgical history on file.  Filed Vitals:   01/09/16 1518  BP: 129/84  Pulse: 93    Visit Diagnosis:  Bilateral low back pain with sciatica, sciatica laterality unspecified      Subjective Assessment - 01/09/16 1518    Subjective States that her back is hurting her today and has had to work 5 days straight. States that pain is mostly in calves. Requested BP taken today secondary to diastolic BP taken yesterday was high following recent stress at home and not sleeping well last night due to the stress.   Limitations Sitting;Standing   How long can you sit comfortably? 10-15 minutes.   How long can you stand comfortably? 10-15 minutes.   Patient Stated Goals Get rid of my leg pain.   Currently in Pain? Yes   Pain Score 5    Pain Location Leg   Pain Orientation Left;Right   Pain Descriptors / Indicators Throbbing   Pain Type Acute pain   Pain Onset More than a month ago            [facility] PT Assessment - 01/09/16 0001    Assessment   Medical Diagnosis Lumbar disc bulge.   Next MD Visit none scheduled   Precautions   Precautions None                     [facility] Adult PT Treatment/Exercise - 01/09/16 0001    Lumbar Exercises: Stretches   Passive Hamstring Stretch 3 reps;30 seconds  RLE   Lumbar Exercises: Aerobic   Stationary Bike NuStep L5 x12 min with core activation VCs   Lumbar Exercises: Supine   Bridge 20 reps   Lumbar Exercises: Prone   Other Prone Lumbar Exercises POE x3 min; diminished pain but experienced tingling   Lumbar Exercises: Quadruped   Straight Leg Raise 15 reps;Other (comment)  BLE   Opposite Arm/Leg Raise Right arm/Left leg;Left arm/Right leg;15 reps   Modalities   Modalities Traction   Traction   Type of Traction Lumbar   Min (lbs) 5   Max (lbs) 86   Hold Time 99   Rest Time 5   Time 15                     PT Long Term Goals - 01/04/16 1500    PT LONG TERM GOAL #1   Title Ind with HEP.   Time 6   Period Weeks   Status Achieved  01/04/16   PT LONG TERM GOAL #2   Title Sit 30 minutes with pain not > 3/10.   Time 6   Period Weeks   Status On-going  5-6/10 (01/04/16/)   PT LONG TERM GOAL #3   Title Stand 30 minutes  wiht pain not > 3/10.   Time 6   Period Weeks   Status On-going   PT LONG TERM GOAL #4   Title Perform work activiities with pain not > 3/10.   Time 6   Period Weeks   Status On-going  4/10 (01/04/16)   PT LONG TERM GOAL #5   Title Eliminate LE pain.   Time 6   Period Weeks   Status On-going  reports 45% LE symptoms remain (01/04/16)               Plan - 01/09/16 1637    Clinical Impression Statement Patient tolerated today's treatment fairly well today although she arrived with increased BLE pain which she attributed to fatigue from work and recent stress. Patient experienced diminished low back pain with POE exercise although she still experienced LE tingling. Patient was educated regarding release phenomenon with nerve pain and how that may be what she is experiencing. Patient demonstrated instability with quadruped activities. Patient reported experiencing a pull sensation in RLE with quadruped leg extension but not present in the LLE. HS stretching was conducted in an effort to decrease pulling  sensation. Patient experienced very little low back pain with briding per patient report. Traction max weight was maintained at 86# today with normal response and denied pain following today's treatment.   Pt will benefit from skilled therapeutic intervention in order to improve on the following deficits Pain;Decreased range of motion;Decreased activity tolerance   Rehab Potential Good   Clinical Impairments Affecting Rehab Potential Patient weight 210#    PT Frequency 2x / week   PT Duration 6 weeks   PT Treatment/Interventions ADLs/Self Care Home Management;Electrical Stimulation;Moist Heat;Therapeutic exercise;Therapeutic activities;Patient/family education;Manual techniques;Traction   PT Next Visit Plan cont with traction at same level and core strengthening and progressing prone extension as tolerated.   PT Home Exercise Plan prone opp arm/leg; prone lying   Consulted and Agree with Plan of Care Patient        Problem List There are no active problems to display for this patient.   Wynelle Fanny, PTA 01/09/2016, 6:13 PM  [facility] [address] [place], [place], [address] Phone: [phone]   Fax:  [phone]  Name: Lydia Alexander MRN: [id number] Date of Birth: Mar 30, 1961

## 2016-01-11 ENCOUNTER — Ambulatory Visit: Payer: BLUE CROSS/BLUE SHIELD | Attending: Adult Health Nurse Practitioner | Admitting: Physical Therapy

## 2016-01-11 DIAGNOSIS — M5441 Lumbago with sciatica, right side: Secondary | ICD-10-CM | POA: Insufficient documentation

## 2016-01-11 DIAGNOSIS — M5442 Lumbago with sciatica, left side: Secondary | ICD-10-CM | POA: Diagnosis present

## 2016-01-11 NOTE — Therapy (Signed)
[facility] [place], [place], [address] Phone: [phone]   Fax:  [phone]  Physical Therapy Treatment  Patient Details  Name: Lydia Alexander MRN: [id number] Date of Birth: 01-14-1961 Referring Provider: Lars Mage MD.  Encounter Date: 01/11/2016      PT End of Session - 01/11/16 1504    Visit Number 11   Number of Visits 12   Date for PT Re-Evaluation 01/09/16   PT Start Time 1519   PT Stop Time 1605   PT Time Calculation (min) 46 min   Activity Tolerance Patient tolerated treatment well   Behavior During Therapy [facility] for tasks assessed/performed      No past medical history on file.  No past surgical history on file.  There were no vitals filed for this visit.  Visit Diagnosis:  Bilateral low back pain with sciatica, sciatica laterality unspecified      Subjective Assessment - 01/11/16 1525    Subjective States that her back/legs are similar to how they were the other day. Patient had oral surgery yesterday with orders to avoid prone or quadruped exercises where her head was done. States that she is better than she was prior to PT and reports feeling stronger.   Limitations Sitting;Standing   How long can you sit comfortably? 10-15 minutes.   How long can you stand comfortably? 10-15 minutes.   Patient Stated Goals Get rid of my leg pain.   Currently in Pain? Yes   Pain Score 3    Pain Location Leg   Pain Orientation Left;Right   Pain Descriptors / Indicators Throbbing   Pain Type Acute pain   Pain Onset More than a month ago            [facility] PT Assessment - 01/11/16 0001    Assessment   Medical Diagnosis Lumbar disc bulge.   Next MD Visit none scheduled   Precautions   Precautions None                     [facility] Adult PT Treatment/Exercise - 01/11/16 0001    Lumbar Exercises: Stretches   Passive Hamstring Stretch 3 reps;30 seconds  BLE   Lumbar Exercises: Aerobic   Stationary Bike NuStep L6 x12 min   Lumbar Exercises: Supine   Clam 20 reps  Red theraband   Bent Knee Raise 20 reps;Other (comment)  Red theraband   Bridge 20 reps   Straight Leg Raise 20 reps;3 seconds  BLE   Modalities   Modalities Traction   Traction   Type of Traction Lumbar   Min (lbs) 5   Max (lbs) 86   Hold Time 99   Rest Time 5   Time 15                     PT Long Term Goals - 01/11/16 1526    PT LONG TERM GOAL #1   Title Ind with HEP.   Time 6   Period Weeks   Status Achieved  01/04/16   PT LONG TERM GOAL #2   Title Sit 30 minutes with pain not > 3/10.   Time 6   Period Weeks   Status Achieved  Achieved 01/11/2016   PT LONG TERM GOAL #3   Title Stand 30 minutes wiht pain not > 3/10.   Time 6   Period Weeks   Status On-going  4-5/10 pain but states she stops and completes activities at her pace  as of 01/11/2016   PT LONG TERM GOAL #4   Title Perform work activiities with pain not > 3/10.   Time 6   Period Weeks   Status On-going  States that at borderline 3/10 pain per patient report 01/11/2016   PT LONG TERM GOAL #5   Title Eliminate LE pain.   Time 6   Period Weeks   Status On-going  Reports LE symptoms at least 60% better per patient report 01/11/2016               Plan - 01/11/16 1551    Clinical Impression Statement Patient tolerated today's treatment well with no reports of increased lumbar or LE pain or sensation with any of the exercises completed. All exercises completed today were directed with VCs for core activation to strengthen core muscles and improve lumbar stabilization. All exercises were completed in supine today as to abide by directions following oral surgery yesterday. Traction max weight as maintained at 86# again secondary to patient reporting improvement. Achieved LT goal regarding sitting with pain less than 3/10 but standing, work activities and LE symptoms goals remain on-going secondary to continued pain.  Patient reports an overall improvement regarding her back and LE pain and symptoms. Patient denied pain following today's treatment.   Pt will benefit from skilled therapeutic intervention in order to improve on the following deficits Pain;Decreased range of motion;Decreased activity tolerance   Rehab Potential Good   Clinical Impairments Affecting Rehab Potential Patient weight 210#    PT Frequency 2x / week   PT Duration 6 weeks   PT Treatment/Interventions ADLs/Self Care Home Management;Electrical Stimulation;Moist Heat;Therapeutic exercise;Therapeutic activities;Patient/family education;Manual techniques;Traction   PT Next Visit Plan cont with traction at same level and core strengthening and progressing prone extension as tolerated.   PT Home Exercise Plan prone opp arm/leg; prone lying   Consulted and Agree with Plan of Care Patient        Problem List There are no active problems to display for this patient.   Wynelle Fanny, PTA 01/11/2016, 4:09 PM  [facility] [address] [place], [place], [address] Phone: [phone]   Fax:  [phone]  Name: Lydia Alexander MRN: [id number] Date of Birth: 08/23/1961

## 2016-01-18 ENCOUNTER — Encounter: Payer: BLUE CROSS/BLUE SHIELD | Admitting: Physical Therapy

## 2016-01-23 ENCOUNTER — Ambulatory Visit: Payer: BLUE CROSS/BLUE SHIELD | Admitting: Physical Therapy

## 2016-01-23 DIAGNOSIS — M5441 Lumbago with sciatica, right side: Secondary | ICD-10-CM

## 2016-01-23 DIAGNOSIS — M5442 Lumbago with sciatica, left side: Principal | ICD-10-CM

## 2016-01-23 NOTE — Therapy (Signed)
[facility] [place], [place], [address] Phone: [phone]   Fax:  [phone]  Physical Therapy Treatment  Patient Details  Name: Lydia Alexander MRN: [id number] Date of Birth: 08/28/1961 Referring Provider: Lars Mage MD.  Encounter Date: 01/23/2016      PT End of Session - 01/23/16 1511    Visit Number 12   Number of Visits 12   Date for PT Re-Evaluation 01/09/16   PT Start Time 0141   PT Stop Time 1606   PT Time Calculation (min) 50 min   Activity Tolerance Patient tolerated treatment well   Behavior During Therapy [facility] for tasks assessed/performed      No past medical history on file.  No past surgical history on file.  There were no vitals filed for this visit.  Visit Diagnosis:  Bilateral low back pain with sciatica, sciatica laterality unspecified      Subjective Assessment - 01/23/16 1517    Subjective States that LE pain is tolerable today and better than when she started.   Limitations Sitting;Standing   How long can you sit comfortably? 10-15 minutes.   How long can you stand comfortably? 10-15 minutes.   Patient Stated Goals Get rid of my leg pain.   Currently in Pain? Yes   Pain Score 4    Pain Location Leg   Pain Orientation Left   Pain Descriptors / Indicators Throbbing   Pain Type Acute pain   Pain Onset More than a month ago            [facility] PT Assessment - 01/23/16 0001    Assessment   Medical Diagnosis Lumbar disc bulge.   Next MD Visit none scheduled   Precautions   Precautions None                     [facility] Adult PT Treatment/Exercise - 01/23/16 0001    Lumbar Exercises: Stretches   Passive Hamstring Stretch 3 reps;30 seconds  BLE   Lumbar Exercises: Aerobic   Stationary Bike NuStep L7 x12 min   Lumbar Exercises: Chief Strategy Officer;Both  3x 10 reps   Shoulder Extension Strengthening;Both  3x10 reps   Lumbar Exercises: Supine   Bridge 20  reps  with clamshell and red theraband   Straight Leg Raise 20 reps;3 seconds  BLE   Lumbar Exercises: Quadruped   Straight Leg Raise 15 reps;Other (comment)  BLE   Opposite Arm/Leg Raise Right arm/Left leg;Left arm/Right leg;15 reps   Modalities   Modalities Traction   Traction   Type of Traction Lumbar   Min (lbs) 5   Max (lbs) 86   Hold Time 99   Rest Time 5   Time 15                     PT Long Term Goals - 01/23/16 1518    PT LONG TERM GOAL #1   Title Ind with HEP.   Time 6   Period Weeks   Status Achieved  01/04/16   PT LONG TERM GOAL #2   Title Sit 30 minutes with pain not > 3/10.   Time 6   Period Weeks   Status Achieved  Achieved 01/11/2016   PT LONG TERM GOAL #3   Title Stand 30 minutes wiht pain not > 3/10.   Time 6   Period Weeks   Status Achieved   PT LONG TERM GOAL #4   Title Perform  work activiities with pain not > 3/10.   Time 6   Period Weeks   Status Partially Met  Depends on how heavy the objects are but has the options to rest per patient report 01/23/2016   PT LONG TERM GOAL #5   Title Eliminate LE pain.   Time 6   Period Weeks   Status Partially Met  Continues to have LE symptoms but now tolerable and better than when she started 01/23/2016               Plan - 01/23/16 1520    Clinical Impression Statement Patient has progressed well overall with PT since beginning due to low back pain. Patient has achieved all goals set at evaluation except for LE pain elimination and work activities goal. Patient partially met the work activities and LE pain elimination goal secondary to continued LE symptoms that are more tolerable and better than before per patient report as well as depending on how heavy objects are at work at to whether she can complete with pain less than 3/10. Patient does have the option to rest at work if needed per patient report. Patient completed all exercises well with minimal multimodal cueing for proper  exercise technique without report of increased pain or discomfort. Traction was maintained again at 86# max weight today. Patient experienced very little pain following today's treatment and was educated that if back pain got any worse to call MD about returning to PT.   Pt will benefit from skilled therapeutic intervention in order to improve on the following deficits Pain;Decreased range of motion;Decreased activity tolerance   Rehab Potential Good   Clinical Impairments Affecting Rehab Potential Patient weight 210#    PT Frequency 2x / week   PT Duration 6 weeks   PT Treatment/Interventions ADLs/Self Care Home Management;Electrical Stimulation;Moist Heat;Therapeutic exercise;Therapeutic activities;Patient/family education;Manual techniques;Traction   PT Next Visit Plan Communicate to MPT of need for D/C summary.   PT Home Exercise Plan prone opp arm/leg; prone lying   Consulted and Agree with Plan of Care Patient        Problem List There are no active problems to display for this patient.  PHYSICAL THERAPY DISCHARGE SUMMARY  Visits from Start of Care: 12  Current functional level related to goals / functional outcomes: Please see above.   Remaining deficits: Goals #4 and 5 partially met.   Education / Equipment: HEP.  Plan: Patient agrees to discharge.  Patient goals were partially met. Patient is being discharged due to being pleased with the current functional level.  ?????      APP[place], [place] MPT 01/23/2016, 5:37 PM  [facility] [address] [place], [place], [address] Phone: [phone]   Fax:  [phone]  Name: Lydia Alexander MRN: [id number] Date of Birth: [birth year]

## 2016-01-23 NOTE — Therapy (Signed)
[facility] [place], [place], [address] Phone: [phone]   Fax:  [phone]  Physical Therapy Treatment  Patient Details  Name: Lydia Alexander MRN: [id number] Date of Birth: 08/28/1961 Referring Provider: Lars Mage MD.  Encounter Date: 01/23/2016      PT End of Session - 01/23/16 1511    Visit Number 12   Number of Visits 12   Date for PT Re-Evaluation 01/09/16   PT Start Time 0141   PT Stop Time 1606   PT Time Calculation (min) 50 min   Activity Tolerance Patient tolerated treatment well   Behavior During Therapy [facility] for tasks assessed/performed      No past medical history on file.  No past surgical history on file.  There were no vitals filed for this visit.  Visit Diagnosis:  Bilateral low back pain with sciatica, sciatica laterality unspecified      Subjective Assessment - 01/23/16 1517    Subjective States that LE pain is tolerable today and better than when she started.   Limitations Sitting;Standing   How long can you sit comfortably? 10-15 minutes.   How long can you stand comfortably? 10-15 minutes.   Patient Stated Goals Get rid of my leg pain.   Currently in Pain? Yes   Pain Score 4    Pain Location Leg   Pain Orientation Left   Pain Descriptors / Indicators Throbbing   Pain Type Acute pain   Pain Onset More than a month ago            [facility] PT Assessment - 01/23/16 0001    Assessment   Medical Diagnosis Lumbar disc bulge.   Next MD Visit none scheduled   Precautions   Precautions None                     [facility] Adult PT Treatment/Exercise - 01/23/16 0001    Lumbar Exercises: Stretches   Passive Hamstring Stretch 3 reps;30 seconds  BLE   Lumbar Exercises: Aerobic   Stationary Bike NuStep L7 x12 min   Lumbar Exercises: Chief Strategy Officer;Both  3x 10 reps   Shoulder Extension Strengthening;Both  3x10 reps   Lumbar Exercises: Supine   Bridge 20  reps  with clamshell and red theraband   Straight Leg Raise 20 reps;3 seconds  BLE   Lumbar Exercises: Quadruped   Straight Leg Raise 15 reps;Other (comment)  BLE   Opposite Arm/Leg Raise Right arm/Left leg;Left arm/Right leg;15 reps   Modalities   Modalities Traction   Traction   Type of Traction Lumbar   Min (lbs) 5   Max (lbs) 86   Hold Time 99   Rest Time 5   Time 15                     PT Long Term Goals - 01/23/16 1518    PT LONG TERM GOAL #1   Title Ind with HEP.   Time 6   Period Weeks   Status Achieved  01/04/16   PT LONG TERM GOAL #2   Title Sit 30 minutes with pain not > 3/10.   Time 6   Period Weeks   Status Achieved  Achieved 01/11/2016   PT LONG TERM GOAL #3   Title Stand 30 minutes wiht pain not > 3/10.   Time 6   Period Weeks   Status Achieved   PT LONG TERM GOAL #4   Title Perform  work activiities with pain not > 3/10.   Time 6   Period Weeks   Status Partially Met  Depends on how heavy the objects are but has the options to rest per patient report 01/23/2016   PT LONG TERM GOAL #5   Title Eliminate LE pain.   Time 6   Period Weeks   Status Partially Met  Continues to have LE symptoms but now tolerable and better than when she started 01/23/2016               Plan - 01/23/16 1520    Clinical Impression Statement Patient has progressed well overall with PT since beginning due to low back pain. Patient has achieved all goals set at evaluation except for LE pain elimination and work activities goal. Patient partially met the work activities and LE pain elimination goal secondary to continued LE symptoms that are more tolerable and better than before per patient report as well as depending on how heavy objects are at work at to whether she can complete with pain less than 3/10. Patient does have the option to rest at work if needed per patient report. Patient completed all exercises well with minimal multimodal cueing for proper  exercise technique without report of increased pain or discomfort. Traction was maintained again at 86# max weight today. Patient experienced very little pain following today's treatment and was educated that if back pain got any worse to call MD about returning to PT.   Pt will benefit from skilled therapeutic intervention in order to improve on the following deficits Pain;Decreased range of motion;Decreased activity tolerance   Rehab Potential Good   Clinical Impairments Affecting Rehab Potential Patient weight 210#    PT Frequency 2x / week   PT Duration 6 weeks   PT Treatment/Interventions ADLs/Self Care Home Management;Electrical Stimulation;Moist Heat;Therapeutic exercise;Therapeutic activities;Patient/family education;Manual techniques;Traction   PT Next Visit Plan Communicate to MPT of need for D/C summary.   PT Home Exercise Plan prone opp arm/leg; prone lying   Consulted and Agree with Plan of Care Patient        Problem List There are no active problems to display for this patient.   Ahmed Prima, PTA 01/23/2016 [facility] [place], [place], [address] Phone: [phone]   Fax:  [phone]  Name: Lydia Alexander MRN: [id number] Date of Birth: [birth year]

## 2017-07-30 ENCOUNTER — Ambulatory Visit (HOSPITAL_COMMUNITY)
Admission: RE | Admit: 2017-07-30 | Discharge: 2017-07-30 | Disposition: A | Discharge status: Home / Self Care | Payer: BLUE CROSS/BLUE SHIELD | Source: Ambulatory Visit | Attending: Adult Health Nurse Practitioner | Admitting: Adult Health Nurse Practitioner

## 2017-07-30 ENCOUNTER — Other Ambulatory Visit (HOSPITAL_COMMUNITY): Payer: Self-pay | Admitting: Adult Health Nurse Practitioner

## 2017-07-30 DIAGNOSIS — M542 Cervicalgia: Secondary | ICD-10-CM | POA: Diagnosis present

## 2017-07-30 DIAGNOSIS — M47816 Spondylosis without myelopathy or radiculopathy, lumbar region: Secondary | ICD-10-CM | POA: Diagnosis not present

## 2017-07-30 DIAGNOSIS — M5136 Other intervertebral disc degeneration, lumbar region: Secondary | ICD-10-CM

## 2017-07-30 DIAGNOSIS — R52 Pain, unspecified: Secondary | ICD-10-CM

## 2017-07-30 DIAGNOSIS — M2578 Osteophyte, vertebrae: Secondary | ICD-10-CM | POA: Diagnosis not present

## 2017-07-30 DIAGNOSIS — M47812 Spondylosis without myelopathy or radiculopathy, cervical region: Secondary | ICD-10-CM | POA: Insufficient documentation

## 2020-03-02 ENCOUNTER — Ambulatory Visit: Payer: Self-pay | Attending: Internal Medicine

## 2020-03-02 DIAGNOSIS — Z23 Encounter for immunization: Secondary | ICD-10-CM

## 2020-03-02 NOTE — Progress Notes (Signed)
   Covid-19 Vaccination Clinic  Name:  Lydia Alexander    MRN: [id number] DOB: Feb 11, 1961  03/02/2020  Lydia Alexander was observed post Covid-19 immunization for 15 minutes without incident. She was provided with Vaccine Information Sheet and instruction to access the V-Safe system.   Lydia Alexander was instructed to call 911 with any severe reactions post vaccine: Marland Kitchen Difficulty breathing  . Swelling of face and throat  . A fast heartbeat  . A bad rash all over body  . Dizziness and weakness   Immunizations Administered    Name Date Dose VIS Date Route   Moderna COVID-19 Vaccine 03/02/2020  1:17 PM 0.5 mL 10/2019 Intramuscular   Manufacturer: Levan Hurst   Lot: [id number]   Belva: 44619-012-22

## 2020-03-18 ENCOUNTER — Encounter (HOSPITAL_COMMUNITY): Payer: Self-pay

## 2020-03-18 ENCOUNTER — Other Ambulatory Visit: Payer: Self-pay

## 2020-03-18 ENCOUNTER — Emergency Department (HOSPITAL_COMMUNITY)
Admission: EM | Admit: 2020-03-18 | Discharge: 2020-03-18 | Disposition: A | Discharge status: Home / Self Care | Payer: BLUE CROSS/BLUE SHIELD | Attending: Emergency Medicine | Admitting: Emergency Medicine

## 2020-03-18 DIAGNOSIS — M7989 Other specified soft tissue disorders: Secondary | ICD-10-CM | POA: Insufficient documentation

## 2020-03-18 DIAGNOSIS — F17210 Nicotine dependence, cigarettes, uncomplicated: Secondary | ICD-10-CM | POA: Diagnosis not present

## 2020-03-18 DIAGNOSIS — L539 Erythematous condition, unspecified: Secondary | ICD-10-CM | POA: Diagnosis not present

## 2020-03-18 HISTORY — DX: Depression, unspecified: F32.A

## 2020-03-18 HISTORY — DX: Dorsalgia, unspecified: M54.9

## 2020-03-18 LAB — COMPREHENSIVE METABOLIC PANEL
ALT: 27 U/L (ref 0–44)
AST: 21 U/L (ref 15–41)
Albumin: 3.8 g/dL (ref 3.5–5.0)
Alkaline Phosphatase: 113 U/L (ref 38–126)
Anion gap: 11 (ref 5–15)
BUN: 10 mg/dL (ref 6–20)
CO2: 25 mmol/L (ref 22–32)
Calcium: 8.6 mg/dL — ABNORMAL LOW (ref 8.9–10.3)
Chloride: 105 mmol/L (ref 98–111)
Creatinine, Ser: 0.76 mg/dL (ref 0.44–1.00)
GFR calc Af Amer: >60 mL/min (ref >60)
GFR calc non Af Amer: >60 mL/min (ref >60)
Glucose, Bld: 122 mg/dL — ABNORMAL HIGH (ref 70–99)
Potassium: 3.5 mmol/L (ref 3.5–5.1)
Sodium: 141 mmol/L (ref 135–145)
Total Bilirubin: 0.5 mg/dL (ref 0.3–1.2)
Total Protein: 7.3 g/dL (ref 6.5–8.1)

## 2020-03-18 LAB — CBC WITH DIFFERENTIAL/PLATELET
Abs Immature Granulocytes: 0.03 10*3/uL (ref 0.00–0.07)
Basophils Absolute: 0.1 10*3/uL (ref 0.0–0.1)
Basophils Relative: 1 %
Eosinophils Absolute: 0.2 10*3/uL (ref 0.0–0.5)
Eosinophils Relative: 3 %
HCT: 47.6 % — ABNORMAL HIGH (ref 36.0–46.0)
Hemoglobin: 15.2 g/dL — ABNORMAL HIGH (ref 12.0–15.0)
Immature Granulocytes: 1 %
Lymphocytes Relative: 35 %
Lymphs Abs: 2.3 10*3/uL (ref 0.7–4.0)
MCH: 34.2 pg — ABNORMAL HIGH (ref 26.0–34.0)
MCHC: 31.9 g/dL (ref 30.0–36.0)
MCV: 107.2 fL — ABNORMAL HIGH (ref 80.0–100.0)
Monocytes Absolute: 0.4 10*3/uL (ref 0.1–1.0)
Monocytes Relative: 7 %
Neutro Abs: 3.5 10*3/uL (ref 1.7–7.7)
Neutrophils Relative %: 53 %
Platelets: 197 10*3/uL (ref 150–400)
RBC: 4.44 MIL/uL (ref 3.87–5.11)
RDW: 13.2 % (ref 11.5–15.5)
WBC: 6.4 10*3/uL (ref 4.0–10.5)
nRBC: 0 % (ref 0.0–0.2)

## 2020-03-18 LAB — D-DIMER, QUANTITATIVE (NOT AT ARMC): D-Dimer, Quant: 0.91 ug/mL-FEU — ABNORMAL HIGH (ref 0.00–0.50)

## 2020-03-18 LAB — BRAIN NATRIURETIC PEPTIDE: B Natriuretic Peptide: 36 pg/mL (ref 0.0–100.0)

## 2020-03-18 MED ORDER — FUROSEMIDE 10 MG/ML IJ SOLN
40.0000 mg | Freq: Once | INTRAMUSCULAR | Status: AC
  Administered 2020-03-18: 40 mg via INTRAVENOUS
  Filled 2020-03-18: qty 4

## 2020-03-18 MED ORDER — ENOXAPARIN SODIUM 100 MG/ML ~~LOC~~ SOLN
1.0000 mg/kg | Freq: Once | SUBCUTANEOUS | Status: AC
  Administered 2020-03-18: 02:00:00 100 mg via SUBCUTANEOUS
  Filled 2020-03-18: qty 1

## 2020-03-18 NOTE — ED Notes (Signed)
Pt c/o bilateral lower extremity swelling that started yesterday, pt was seen by pcp yesterday, given prescription for lasix but pt did not get prescription refilled due to swelling was better. Swelling noted to bilateral lower extremities with redness noted to top of legs as well, pt reports that she was at the beach last weekend and may have gotten sunburned as well.

## 2020-03-18 NOTE — Discharge Instructions (Addendum)
Please call [phone] at 830 this morning to get an appointment to return to get the Doppler ultrasound of your legs done to look for blood clots.  You were given a blood thinner shot, Lovenox, prior to leaving the ED tonight.  The ED doctor will tell you the results of your tests in the morning.  If it is negative I think you are having some sunburn and it should slowly improve.  You can take the Lasix your primary care provider ordered for you to help with the swelling.  Recheck if you get chest pain, shortness of breath, fever, or increasing pain, redness, or swelling.

## 2020-03-18 NOTE — ED Provider Notes (Signed)
[facility] EMERGENCY DEPARTMENT Provider Note   CSN: [id number] Arrival date & time: 03/18/20  0003   Time seen 12:22 AM  History Chief Complaint  Patient presents with  . Leg Swelling    Lydia Alexander is a 59 y.o. female.  HPI   Patient states that the evening before, May 6 she started having swelling of both of her legs, she feels like the left is worse than the right.  She denies any calf pain but states her feet are "stinging" sometimes.  She denies chest pain, shortness of breath, abdominal swelling or bloating, or fever.  She states she has had some nausea without vomiting.  She states she is never had swelling before.  She was at the beach on the first and states she put a beach towel over her legs and she did not notice if she had any redness of her legs until yesterday.  She was seen by her PCP today, May 7 around 11 AM and was prescribed Lasix which she did not get filled.  She states she felt like this evening her swelling was getting worse so she came to the ED.  PCP Dr. Mer[facility]   Past Medical History:  Diagnosis Date  . Back pain   . Depression     There are no problems to display for this patient. From her doctors note yesterday Mixed anxiety and depressive disorder Obesity Mixed hyperlipidemia Vitamin D deficiency Vitamin B12 deficiency not anemic Chronic back pain  History reviewed. No pertinent surgical history.   OB History   No obstetric history on file.     No family history on file.  Grandmother died of MI  Social History   Tobacco Use  . Smoking status: Current Every Day Smoker  . Smokeless tobacco: Never Used  Substance Use Topics  . Alcohol use: Yes  . Drug use: Never    Home Medications Prior to Admission medications   Not on File  Acetaminophen 300 mg/codeine 30 mg 1 tablet every 6-8 hours Alprazolam 1 mg twice a day Atorvastatin 10 mg tablets once a day Flexeril 10 mg tablets 3 times a  day Vitamin B12 1250 mcg 1 capsule by mouth once a week Ibuprofen 800 mg every 8 hours Naproxen 500 mg twice a day with meals Trazodone 100 mg every day orally Valtrex 1 g orally twice a day Lasix 20 mg take every day orally prescribed yesterday  Allergies    Patient has no known allergies.  Review of Systems   Review of Systems  All other systems reviewed and are negative.   Physical Exam Updated Vital Signs BP 135/85   Pulse 87   Temp 98.1 F (36.7 C) (Oral)   Resp (!) 24   Ht 5' 3"  (1.6 m)   Wt 101.2 kg   SpO2 (!) 89%   BMI 39.50 kg/m   Physical Exam Vitals and nursing note reviewed.  Constitutional:      General: She is not in acute distress.    Appearance: She is obese.  HENT:     Head: Normocephalic and atraumatic.     Right Ear: External ear normal.     Left Ear: External ear normal.     Nose: Nose normal.  Eyes:     Extraocular Movements: Extraocular movements intact.     Conjunctiva/sclera: Conjunctivae normal.     Pupils: Pupils are equal, round, and reactive to light.  Cardiovascular:     Rate and  Rhythm: Normal rate and regular rhythm.     Pulses: Normal pulses.     Heart sounds: No murmur.  Pulmonary:     Effort: Pulmonary effort is normal. No respiratory distress.     Breath sounds: Normal breath sounds.  Abdominal:     General: There is distension.     Palpations: Abdomen is soft.     Tenderness: There is no abdominal tenderness.     Comments: No edema of the abdominal wall  Musculoskeletal:        General: Swelling present. Normal range of motion.     Cervical back: Normal range of motion and neck supple.     Comments: Patient is noted to have redness from her ankles to below her knees bilaterally.  When I feel her skin it is not warm to touch.  The redness is just noted anteriorly.  Her calves are nontender and are without masses.  There is no pitting edema noted to her extremities.  Her feet are without pitting edema or redness.  Patient  states she was wearing water shoes when she laid out in the sun.  Skin:    General: Skin is warm.     Findings: Erythema present.  Neurological:     General: No focal deficit present.     Mental Status: She is alert and oriented to person, place, and time.     Cranial Nerves: No cranial nerve deficit.  Psychiatric:        Mood and Affect: Mood normal.        Behavior: Behavior normal.        Thought Content: Thought content normal.       ED Results / Procedures / Treatments   Labs (all labs ordered are listed, but only abnormal results are displayed) Results for orders placed or performed during the hospital encounter of 03/18/20  CBC with Differential  Result Value Ref Range   WBC 6.4 4.0 - 10.5 K/uL   RBC 4.44 3.87 - 5.11 MIL/uL   Hemoglobin 15.2 (H) 12.0 - 15.0 g/dL   HCT 47.6 (H) 36.0 - 46.0 %   MCV 107.2 (H) 80.0 - 100.0 fL   MCH 34.2 (H) 26.0 - 34.0 pg   MCHC 31.9 30.0 - 36.0 g/dL   RDW 13.2 11.5 - 15.5 %   Platelets 197 150 - 400 K/uL   nRBC 0.0 0.0 - 0.2 %   Neutrophils Relative % 53 %   Neutro Abs 3.5 1.7 - 7.7 K/uL   Lymphocytes Relative 35 %   Lymphs Abs 2.3 0.7 - 4.0 K/uL   Monocytes Relative 7 %   Monocytes Absolute 0.4 0.1 - 1.0 K/uL   Eosinophils Relative 3 %   Eosinophils Absolute 0.2 0.0 - 0.5 K/uL   Basophils Relative 1 %   Basophils Absolute 0.1 0.0 - 0.1 K/uL   Immature Granulocytes 1 %   Abs Immature Granulocytes 0.03 0.00 - 0.07 K/uL  D-dimer, quantitative  Result Value Ref Range   D-Dimer, Quant 0.91 (H) 0.00 - 0.50 ug/mL-FEU  Brain natriuretic peptide  Result Value Ref Range   B Natriuretic Peptide 36.0 0.0 - 100.0 pg/mL  Comprehensive metabolic panel  Result Value Ref Range   Sodium 141 135 - 145 mmol/L   Potassium 3.5 3.5 - 5.1 mmol/L   Chloride 105 98 - 111 mmol/L   CO2 25 22 - 32 mmol/L   Glucose, Bld 122 (H) 70 - 99 mg/dL   BUN 10 6 -  20 mg/dL   Creatinine, Ser 0.76 0.44 - 1.00 mg/dL   Calcium 8.6 (L) 8.9 - 10.3 mg/dL   Total  Protein 7.3 6.5 - 8.1 g/dL   Albumin 3.8 3.5 - 5.0 g/dL   AST 21 15 - 41 U/L   ALT 27 0 - 44 U/L   Alkaline Phosphatase 113 38 - 126 U/L   Total Bilirubin 0.5 0.3 - 1.2 mg/dL   GFR calc non Af Amer >60 >60 mL/min   GFR calc Af Amer >60 >60 mL/min   Anion gap 11 5 - 15     Laboratory interpretation all normal except elevated D-dimer even when adjusted for age     EKG EKG Interpretation  Date/Time:  Saturday Mar 18 2020 00:19:21 EDT Ventricular Rate:  89 PR Interval:    QRS Duration: 106 QT Interval:  374 QTC Calculation: 456 R Axis:   61 Text Interpretation: Sinus rhythm Borderline prolonged PR interval Low voltage, precordial leads No old tracing to compare Confirmed by Rolland Porter [id number]) on 03/18/2020 12:21:06 AM   Radiology No results found.  Procedures Procedures (including critical care time)  Medications Ordered in ED Medications  enoxaparin (LOVENOX) injection 100 mg (has no administration in time range)  furosemide (LASIX) injection 40 mg (40 mg Intravenous Given 03/18/20 0042)    ED Course  I have reviewed the triage vital signs and the nursing notes.  Pertinent labs & imaging results that were available during my care of the patient were reviewed by me and considered in my medical decision making (see chart for details).    MDM Rules/Calculators/A&P                      Patient does states she was laying out in the sun on the first and thought she had had her legs covered with a beach towel.  The redness of her legs could be a mild first-degree sunburn however there is no warmth to the skin.  I would doubt bilateral lower extremity cellulitis, her calves are soft and nontender so not really suspicious for DVT.  She denies any chest pain or shortness of breath to suggest congestive heart failure.  Laboratory testing was done to screen for blood clots and congestive heart failure, and she was given Lasix 40 mg IV.  1:14 AM patient's kidney function, LFTs are  all normal.  Her D-dimer is elevated.  I ordered outpatient bilateral Doppler ultrasound of her legs for later this morning.  She will be given Lovenox prior to discharge from the ED until she can return for her Doppler ultrasound.  Her BMP is still pending.  Things that can cause swelling would be renal disease, liver disease, congestive heart failure, or extremity abnormality such as DVT or cellulitis.  1:30 AM patient's BNP is well within normal.  I talked to the patient and her family member.  She states she has had sun poisoning twice in the past.  We discussed that once you have a severe sunburn and reaction like that you are extra sensitive and you need to be extremely careful.  We discussed the Lovenox and returning in the morning to get the Doppler ultrasound.  If it is normal I believe she may have a mild sunburn with local swelling which is expected with burns.  She had 900 cc of urine output after the 40 mg IV Lasix in the ED.  Her primary care doctor has already prescribed Lasix.  She can take  that as needed for swelling.    Final Clinical Impression(s) / ED Diagnoses Final diagnoses:  Redness and swelling of lower leg    Rx / DC Orders ED Discharge Orders         Ordered    US Venous Img Lower Bilateral (DVT)     03/18/20 0112         Plan discharge  Rolland Porter, MD, Barbette Or, MD 03/18/20 [phone]

## 2020-03-18 NOTE — ED Triage Notes (Signed)
Pt states she saw her pmd yesterday and was given a rx for lasix.  Pt states she hasn't gotten the lasix filled yet, so has not had any improvement.

## 2020-06-20 ENCOUNTER — Other Ambulatory Visit (HOSPITAL_COMMUNITY): Payer: Self-pay | Admitting: Adult Health Nurse Practitioner

## 2020-06-20 DIAGNOSIS — Z1231 Encounter for screening mammogram for malignant neoplasm of breast: Secondary | ICD-10-CM

## 2020-08-02 ENCOUNTER — Inpatient Hospital Stay (HOSPITAL_COMMUNITY)
Admission: EM | Admit: 2020-08-02 | Discharge: 2020-08-05 | DRG: 193 | Disposition: A | Discharge status: Home / Self Care | Payer: Medicare Other | Attending: Internal Medicine | Admitting: Internal Medicine

## 2020-08-02 ENCOUNTER — Emergency Department (HOSPITAL_COMMUNITY): Payer: Medicare Other

## 2020-08-02 ENCOUNTER — Encounter (HOSPITAL_COMMUNITY): Payer: Self-pay

## 2020-08-02 ENCOUNTER — Other Ambulatory Visit: Payer: Self-pay

## 2020-08-02 DIAGNOSIS — M549 Dorsalgia, unspecified: Secondary | ICD-10-CM | POA: Diagnosis present

## 2020-08-02 DIAGNOSIS — J189 Pneumonia, unspecified organism: Principal | ICD-10-CM | POA: Diagnosis present

## 2020-08-02 DIAGNOSIS — R609 Edema, unspecified: Secondary | ICD-10-CM | POA: Diagnosis present

## 2020-08-02 DIAGNOSIS — Z6839 Body mass index (BMI) 39.0-39.9, adult: Secondary | ICD-10-CM | POA: Diagnosis not present

## 2020-08-02 DIAGNOSIS — J449 Chronic obstructive pulmonary disease, unspecified: Secondary | ICD-10-CM | POA: Diagnosis present

## 2020-08-02 DIAGNOSIS — G8929 Other chronic pain: Secondary | ICD-10-CM | POA: Diagnosis present

## 2020-08-02 DIAGNOSIS — R1310 Dysphagia, unspecified: Secondary | ICD-10-CM

## 2020-08-02 DIAGNOSIS — J9601 Acute respiratory failure with hypoxia: Secondary | ICD-10-CM | POA: Diagnosis present

## 2020-08-02 DIAGNOSIS — Z23 Encounter for immunization: Secondary | ICD-10-CM | POA: Diagnosis present

## 2020-08-02 DIAGNOSIS — F329 Major depressive disorder, single episode, unspecified: Secondary | ICD-10-CM | POA: Diagnosis present

## 2020-08-02 DIAGNOSIS — F419 Anxiety disorder, unspecified: Secondary | ICD-10-CM | POA: Diagnosis present

## 2020-08-02 DIAGNOSIS — R600 Localized edema: Secondary | ICD-10-CM | POA: Diagnosis present

## 2020-08-02 DIAGNOSIS — R519 Headache, unspecified: Secondary | ICD-10-CM | POA: Diagnosis present

## 2020-08-02 DIAGNOSIS — R1319 Other dysphagia: Secondary | ICD-10-CM

## 2020-08-02 DIAGNOSIS — Z79899 Other long term (current) drug therapy: Secondary | ICD-10-CM | POA: Diagnosis not present

## 2020-08-02 DIAGNOSIS — Z20822 Contact with and (suspected) exposure to covid-19: Secondary | ICD-10-CM | POA: Diagnosis present

## 2020-08-02 DIAGNOSIS — F17210 Nicotine dependence, cigarettes, uncomplicated: Secondary | ICD-10-CM | POA: Diagnosis present

## 2020-08-02 DIAGNOSIS — K224 Dyskinesia of esophagus: Secondary | ICD-10-CM | POA: Diagnosis present

## 2020-08-02 DIAGNOSIS — E785 Hyperlipidemia, unspecified: Secondary | ICD-10-CM | POA: Diagnosis present

## 2020-08-02 LAB — TROPONIN I (HIGH SENSITIVITY)
Troponin I (High Sensitivity): 20 ng/L — ABNORMAL HIGH (ref <18)
Troponin I (High Sensitivity): 20 ng/L — ABNORMAL HIGH (ref <18)

## 2020-08-02 LAB — URINALYSIS, ROUTINE W REFLEX MICROSCOPIC
Glucose, UA: NEGATIVE mg/dL
Hgb urine dipstick: NEGATIVE
Ketones, ur: NEGATIVE mg/dL
Nitrite: NEGATIVE
Protein, ur: 30 mg/dL — AB
Specific Gravity, Urine: 1.028 (ref 1.005–1.030)
Squamous Epithelial / LPF: >50 — ABNORMAL HIGH (ref 0–5)
pH: 5 (ref 5.0–8.0)

## 2020-08-02 LAB — HIV ANTIBODY (ROUTINE TESTING W REFLEX): HIV Screen 4th Generation wRfx: NONREACTIVE

## 2020-08-02 LAB — CBC
HCT: 43.7 % (ref 36.0–46.0)
Hemoglobin: 13 g/dL (ref 12.0–15.0)
MCH: 32.6 pg (ref 26.0–34.0)
MCHC: 29.7 g/dL — ABNORMAL LOW (ref 30.0–36.0)
MCV: 109.5 fL — ABNORMAL HIGH (ref 80.0–100.0)
Platelets: 173 10*3/uL (ref 150–400)
RBC: 3.99 MIL/uL (ref 3.87–5.11)
RDW: 14.3 % (ref 11.5–15.5)
WBC: 7.3 10*3/uL (ref 4.0–10.5)
nRBC: 0 % (ref 0.0–0.2)

## 2020-08-02 LAB — BASIC METABOLIC PANEL
Anion gap: 8 (ref 5–15)
BUN: 12 mg/dL (ref 6–20)
CO2: 29 mmol/L (ref 22–32)
Calcium: 8.5 mg/dL — ABNORMAL LOW (ref 8.9–10.3)
Chloride: 103 mmol/L (ref 98–111)
Creatinine, Ser: 0.74 mg/dL (ref 0.44–1.00)
GFR calc Af Amer: >60 mL/min (ref >60)
GFR calc non Af Amer: >60 mL/min (ref >60)
Glucose, Bld: 101 mg/dL — ABNORMAL HIGH (ref 70–99)
Potassium: 3.8 mmol/L (ref 3.5–5.1)
Sodium: 140 mmol/L (ref 135–145)

## 2020-08-02 LAB — FIBRINOGEN: Fibrinogen: 492 mg/dL — ABNORMAL HIGH (ref 210–475)

## 2020-08-02 LAB — LACTIC ACID, PLASMA: Lactic Acid, Venous: 0.7 mmol/L (ref 0.5–1.9)

## 2020-08-02 LAB — LACTATE DEHYDROGENASE: LDH: 121 U/L (ref 98–192)

## 2020-08-02 LAB — C-REACTIVE PROTEIN: CRP: 6.2 mg/dL — ABNORMAL HIGH (ref <1.0)

## 2020-08-02 LAB — FERRITIN: Ferritin: 25 ng/mL (ref 11–307)

## 2020-08-02 LAB — PROCALCITONIN: Procalcitonin: <0.1 ng/mL

## 2020-08-02 LAB — D-DIMER, QUANTITATIVE (NOT AT ARMC): D-Dimer, Quant: 0.57 ug/mL-FEU — ABNORMAL HIGH (ref 0.00–0.50)

## 2020-08-02 LAB — SARS CORONAVIRUS 2 BY RT PCR (HOSPITAL ORDER, PERFORMED IN ~~LOC~~ HOSPITAL LAB): SARS Coronavirus 2: NEGATIVE

## 2020-08-02 LAB — TRIGLYCERIDES: Triglycerides: 77 mg/dL (ref <150)

## 2020-08-02 MED ORDER — SODIUM CHLORIDE 0.9 % IV SOLN
500.0000 mg | Freq: Once | INTRAVENOUS | Status: AC
  Administered 2020-08-02: 500 mg via INTRAVENOUS
  Filled 2020-08-02: qty 500

## 2020-08-02 MED ORDER — SODIUM CHLORIDE 0.9 % IV SOLN: 500.0000 mg | INTRAVENOUS | Status: DC

## 2020-08-02 MED ORDER — SODIUM CHLORIDE 0.9 % IV SOLN
500.0000 mg | INTRAVENOUS | Status: DC
  Administered 2020-08-03 – 2020-08-04 (×2): 500 mg via INTRAVENOUS
  Filled 2020-08-02 (×2): qty 500

## 2020-08-02 MED ORDER — SODIUM CHLORIDE 0.9 % IV SOLN
2.0000 g | Freq: Once | INTRAVENOUS | Status: AC
  Administered 2020-08-02: 2 g via INTRAVENOUS
  Filled 2020-08-02: qty 20

## 2020-08-02 MED ORDER — NICOTINE 21 MG/24HR TD PT24
21.0000 mg | MEDICATED_PATCH | Freq: Every day | TRANSDERMAL | Status: DC
  Administered 2020-08-03 – 2020-08-05 (×3): 21 mg via TRANSDERMAL
  Filled 2020-08-02 (×3): qty 1

## 2020-08-02 MED ORDER — CYCLOBENZAPRINE HCL 10 MG PO TABS
10.0000 mg | ORAL_TABLET | Freq: Three times a day (TID) | ORAL | Status: DC | PRN
  Administered 2020-08-04: 10 mg via ORAL
  Filled 2020-08-02: qty 1

## 2020-08-02 MED ORDER — INFLUENZA VAC SPLIT QUAD 0.5 ML IM SUSY
0.5000 mL | PREFILLED_SYRINGE | INTRAMUSCULAR | Status: AC
  Administered 2020-08-03: 0.5 mL via INTRAMUSCULAR
  Filled 2020-08-02: qty 0.5

## 2020-08-02 MED ORDER — SODIUM CHLORIDE 0.9 % IV SOLN
2.0000 g | INTRAVENOUS | Status: DC
  Administered 2020-08-03 – 2020-08-04 (×2): 2 g via INTRAVENOUS
  Filled 2020-08-02 (×3): qty 20

## 2020-08-02 MED ORDER — SODIUM CHLORIDE 0.9 % IV SOLN: 2.0000 g | INTRAVENOUS | Status: DC

## 2020-08-02 MED ORDER — ATORVASTATIN CALCIUM 10 MG PO TABS
10.0000 mg | ORAL_TABLET | Freq: Every day | ORAL | Status: DC
  Administered 2020-08-02 – 2020-08-04 (×3): 10 mg via ORAL
  Filled 2020-08-02 (×3): qty 1

## 2020-08-02 MED ORDER — GABAPENTIN 300 MG PO CAPS
600.0000 mg | ORAL_CAPSULE | Freq: Three times a day (TID) | ORAL | Status: DC
  Administered 2020-08-02 – 2020-08-05 (×8): 600 mg via ORAL
  Filled 2020-08-02 (×8): qty 2

## 2020-08-02 MED ORDER — ALPRAZOLAM 1 MG PO TABS
1.0000 mg | ORAL_TABLET | Freq: Two times a day (BID) | ORAL | Status: DC
  Administered 2020-08-02 – 2020-08-05 (×6): 1 mg via ORAL
  Filled 2020-08-02 (×6): qty 1

## 2020-08-02 MED ORDER — SODIUM CHLORIDE 0.9 % IV BOLUS
1000.0000 mL | Freq: Once | INTRAVENOUS | Status: AC
  Administered 2020-08-02: 1000 mL via INTRAVENOUS

## 2020-08-02 MED ORDER — ENOXAPARIN SODIUM 40 MG/0.4ML ~~LOC~~ SOLN: 40.0000 mg | SUBCUTANEOUS | Status: DC

## 2020-08-02 MED ORDER — VITAMIN D (ERGOCALCIFEROL) 1.25 MG (50000 UNIT) PO CAPS: 50000.0000 [IU] | ORAL_CAPSULE | ORAL | Status: DC

## 2020-08-02 NOTE — H&P (Addendum)
Lydia Alexander is an 59 y.o. female.   Chief Complaint: Dizziness, lack of coordination, not feeling like herself. HPI: The patient is a 59 yr old woman who sees an Lydia [facility] in the [facility] for her regular care. She states that she has no medical problems other than chronic back pain and depression. The patient states that since Saturday she has felt dizzy, has been dropping things, and didn't feel safe driving. She was initially worked up for a potential CVA in the ED, but CT head and MRI brain were negative. However the patient was found to be hypoxic at 76% on room air with ambulation. CXR demonstrated left basilar opacities.   In the ED the patient was hypoxic as described above. She was also found to be slightly tachypneic. Blood rpessures were normotensive, and heart rate was normal. Temperature was normal. Troponin's were slightly elevated at 20, but were flat. Lactic acid was 0.7. WBC was normal.   The patient is denying fevers, chills, cough, sputum production, sensation of shortness of breath, nausea, vomiting, diarrhea, constipation, or chest pain. There has been no hematesis, coffee ground emesis, hematochezia, or melena. She does admit to lower extremity edema since April. She was given a few lasix by her Lydia Alexander for this in April, but it has continued.  [facility] were consulted to admit the patient for further evaluation and treatment.  Past Medical History:  Diagnosis Date   Back pain    Depression     History reviewed. No pertinent surgical history.  No family history on file. Social History:  reports that she has been smoking. She has never used smokeless tobacco. She reports current alcohol use. She reports that she does not use drugs. (Not in a hospital admission)   Allergies: No Known Allergies  Pertinent items are noted in HPI.  General appearance: alert, cooperative and no distress Head: Normocephalic, without obvious abnormality, atraumatic Eyes:  conjunctivae/corneas clear. PERRL, EOM's intact. Fundi benign. Throat: lips, mucosa, and tongue normal; teeth and gums normal Neck: no adenopathy, no carotid bruit, no JVD, supple, symmetrical, trachea midline and thyroid not enlarged, symmetric, no tenderness/mass/nodules Resp: No increased work of breathing. No wheezes or rhonchi. Mild rales at left base. No tactile fremitus. Chest wall: no tenderness Cardio: regular rate and rhythm, S1, S2 normal, no murmur, click, rub or gallop GI: soft, non-tender; bowel sounds normal; no masses,  no organomegaly Extremities: No cyuanosis or clubbing. 1-2+ pitting edema of lower extremities bilaterally. Mild vernous stasis dermatitis. Pulses: 2+ and symmetric Skin: Skin color, texture, turgor normal. No rashes or lesions Lymph nodes: Cervical, supraclavicular, and axillary nodes normal. Neurologic: Alert and oriented X 3, normal strength and tone. Normal symmetric reflexes. Normal coordination and gait  Results for orders placed or performed during the hospital encounter of 08/02/20 (from the past 48 hour(s))  Urinalysis, Routine w reflex microscopic Urine, Clean Catch     Status: Abnormal   Collection Time: 08/02/20 10:22 AM  Result Value Ref Range   Color, Urine AMBER (A) YELLOW    Comment: BIOCHEMICALS MAY BE AFFECTED BY COLOR   APPearance CLOUDY (A) CLEAR   Specific Gravity, Urine 1.028 1.005 - 1.030   pH 5.0 5.0 - 8.0   Glucose, UA NEGATIVE NEGATIVE mg/dL   Hgb urine dipstick NEGATIVE NEGATIVE   Bilirubin Urine SMALL (A) NEGATIVE   Ketones, ur NEGATIVE NEGATIVE mg/dL   Protein, ur 30 (A) NEGATIVE mg/dL   Nitrite NEGATIVE NEGATIVE   Leukocytes,Ua LARGE (A) NEGATIVE  RBC / HPF 0-5 0 - 5 RBC/hpf   WBC, UA 11-20 0 - 5 WBC/hpf   Bacteria, UA RARE (A) NONE SEEN   Squamous Epithelial / LPF >50 (H) 0 - 5   Mucus PRESENT    Budding Yeast PRESENT     Comment: Performed at [facility], [address], [place], [place] [address]  CBC     Status:  Abnormal   Collection Time: 08/02/20 10:54 AM  Result Value Ref Range   WBC 7.3 4.0 - 10.5 K/uL   RBC 3.99 3.87 - 5.11 MIL/uL   Hemoglobin 13.0 12.0 - 15.0 g/dL   HCT 43.7 36 - 46 %   MCV 109.5 (H) 80.0 - 100.0 fL   MCH 32.6 26.0 - 34.0 pg   MCHC 29.7 (L) 30.0 - 36.0 g/dL   RDW 14.3 11.5 - 15.5 %   Platelets 173 150 - 400 K/uL   nRBC 0.0 0.0 - 0.2 %    Comment: Performed at [facility], [address], [place], [place] [address]  Basic metabolic panel     Status: Abnormal   Collection Time: 08/02/20 10:54 AM  Result Value Ref Range   Sodium 140 135 - 145 mmol/L   Potassium 3.8 3.5 - 5.1 mmol/L   Chloride 103 98 - 111 mmol/L   CO2 29 22 - 32 mmol/L   Glucose, Bld 101 (H) 70 - 99 mg/dL    Comment: Glucose reference range applies only to samples taken after fasting for at least 8 hours.   BUN 12 6 - 20 mg/dL   Creatinine, Ser 0.74 0.44 - 1.00 mg/dL   Calcium 8.5 (L) 8.9 - 10.3 mg/dL   GFR calc non Af Amer >60 >60 mL/min   GFR calc Af Amer >60 >60 mL/min   Anion gap 8 5 - 15    Comment: Performed at [facility], [address], [place], [place] [address]  D-dimer, quantitative (not at [facility])     Status: Abnormal   Collection Time: 08/02/20 11:10 AM  Result Value Ref Range   D-Dimer, Quant 0.57 (H) 0.00 - 0.50 ug/mL-FEU    Comment: (NOTE) At the manufacturer cut-off of 0.50 ug/mL FEU, this assay has been documented to exclude PE with a sensitivity and negative predictive value of 97 to 99%.  At this time, this assay has not been approved by the FDA to exclude DVT/VTE. Results should be correlated with clinical presentation. Performed at [facility], [address], [place], [place] [address]   Troponin I (High Sensitivity)     Status: Abnormal   Collection Time: 08/02/20 11:10 AM  Result Value Ref Range   Troponin I (High Sensitivity) 20 (H) <18 ng/L    Comment: (NOTE) Elevated high sensitivity troponin I (hsTnI) values and significant  changes across serial measurements may  suggest ACS but many other  chronic and acute conditions are known to elevate hsTnI results.  Refer to the "Links" section for chest pain algorithms and additional  guidance. Performed at [facility], [address], [place], [place] [address]   SARS Coronavirus 2 by RT PCR (hospital order, performed in [facility] hospital lab) Nasopharyngeal Nasopharyngeal Swab     Status: None   Collection Time: 08/02/20 12:43 PM   Specimen: Nasopharyngeal Swab  Result Value Ref Range   SARS Coronavirus 2 NEGATIVE NEGATIVE    Comment: (NOTE) SARS-CoV-2 target nucleic acids are NOT DETECTED.  The SARS-CoV-2 RNA is generally detectable in upper and lower respiratory  specimens during the acute phase of infection. The lowest concentration of SARS-CoV-2 viral copies this assay can detect is 250 copies / mL. A negative result does not preclude SARS-CoV-2 infection and should not be used as the sole basis for treatment or other patient management decisions.  A negative result may occur with improper specimen collection / handling, submission of specimen other than nasopharyngeal swab, presence of viral mutation(s) within the areas targeted by this assay, and inadequate number of viral copies (<250 copies / mL). A negative result must be combined with clinical observations, patient history, and epidemiological information.  Fact Sheet for Patients:   StrictlyIdeas.no  Fact Sheet for Healthcare Providers: BankingDealers.co.za  This test is not yet approved or  cleared by the [place] FDA and has been authorized for detection and/or diagnosis of SARS-CoV-2 by FDA under an Emergency Use Authorization (EUA).  This EUA will remain in effect (meaning this test can be used) for the duration of the COVID-19 declaration under Section 564(b)(1) of the Act, 21 U.S.C. section 360bbb-3(b)(1), unless the authorization is terminated or revoked sooner.  Performed  at [facility], [address] [place], [place] [address]   Lactate dehydrogenase     Status: None   Collection Time: 08/02/20  1:30 PM  Result Value Ref Range   LDH 121 98 - 192 U/L    Comment: Performed at [facility], [address], [place], [place] [address]  Ferritin     Status: None   Collection Time: 08/02/20  1:30 PM  Result Value Ref Range   Ferritin 25 11 - 307 ng/mL    Comment: Performed at [facility], [address], [place], [place] [address]  Fibrinogen     Status: Abnormal   Collection Time: 08/02/20  1:30 PM  Result Value Ref Range   Fibrinogen 492 (H) 210 - 475 mg/dL    Comment: Performed at [facility], [address], [place], [place] [address]  C-reactive protein     Status: Abnormal   Collection Time: 08/02/20  1:30 PM  Result Value Ref Range   CRP 6.2 (H) <1.0 mg/dL    Comment: Performed at [facility], [address] [place], [place] [address]  Lactic acid, plasma     Status: None   Collection Time: 08/02/20  1:30 PM  Result Value Ref Range   Lactic Acid, Venous 0.7 0.5 - 1.9 mmol/L    Comment: Performed at [facility], [address], [place], [place] [address]   @[email or web address] @  Blood pressure (!) 121/101, pulse 78, temperature 98.5 F (36.9 C), temperature source Oral, resp. rate (!) 22, height 5' 3"  (1.6 m), weight 101.2 kg, SpO2 96 %.   Assessment/Plan Problem  Acute Respiratory Failure With Hypoxia (Hcc)  Community Acquired Pneumonia  Peripheral Edema  Chronic Back Pain   Acute hypoxic respiratory failure: Pt is currently saturating at 96% on 2L O2. She was saturating 76% on room air with ambulation.  Pneumonia: Likely community acquired pneumonia. She will receive Rocephin and azithromycin. Albuterol will be available should she develop wheezing. Will also provide mucinex.   Peripheral Edema: Will check echocardiogram given chronicity of the problem and pattern of swelling which seems more in line with CHF than Venous stasis.  Monitor.  Chronic back pain: Will make tylenol available for pain.  Depression: Noted. Patient is on no medications at home.  I have seen and examined this patient myself. I have spent 68 minutes in her evaluation and treatment.  DVT Prophylaxis;  Lovenox CODE STATUS: Full Code Family Communication: None available Disposition: The patient will be admitted as inpatient to a telemetry bed.  Severity of Illness: The appropriate patient status for this patient is INPATIENT. Inpatient status is judged to be reasonable and necessary in order to provide the required intensity of service to ensure the patient's safety. The patient's presenting symptoms, physical exam findings, and initial radiographic and laboratory data in the context of their chronic comorbidities is felt to place them at high risk for further clinical deterioration. Furthermore, it is not anticipated that the patient will be medically stable for discharge from the hospital within 2 midnights of admission. The following factors support the patient status of inpatient.   " The patient's presenting symptoms include Dizziness, clumsiness, dropping things. " The worrisome physical exam findings include Hypoxemia, lower extremity edema. " The initial radiographic and laboratory data are worrisome because of Pneumonia. " The chronic co-morbidities include Depression.  * I certify that at the point of admission it is my clinical judgment that the patient will require inpatient hospital care spanning beyond 2 midnights from the point of admission due to high intensity of service, high risk for further deterioration and high frequency of surveillance required.*  Marium Ragan 08/02/2020, 3:14 PM

## 2020-08-02 NOTE — ED Provider Notes (Signed)
[facility] Emergency Department Provider Note MRN:  [id number]  Arrival date & time: 08/02/20     Chief Complaint   Dizziness and Weakness   History of Present Illness   Lydia Alexander is a 59 y.o. year-old female with no pertinent PMH presenting to the ED with chief complaint of dizziness.  Symptoms began 4 days ago with dropping objects, feeling overall strange, feeling lack of coordination with arms, trying to drive a car but frequently crossing the double yellow line.  Has had new headaches since this time as well as dizziness, described as both a lightheadedness and a room spinning sensation.  Denies fever, no cough, no sick contacts, no vomiting or diarrhea, no chest pain or SOB, no abdominal pain, no dysuria.  Symptoms constant, mild, no exacerbating or alleviating symptoms.  Review of Systems  A complete 10 system review of systems was obtained and all systems are negative except as noted in the HPI and PMH.   Patient's Health History    Past Medical History:  Diagnosis Date  . Back pain   . Depression     History reviewed. No pertinent surgical history.  No family history on file.  Social History   Socioeconomic History  . Marital status: Widowed    Spouse name: Not on file  . Number of children: Not on file  . Years of education: Not on file  . Highest education level: Not on file  Occupational History  . Not on file  Tobacco Use  . Smoking status: Current Every Day Smoker  . Smokeless tobacco: Never Used  Substance and Sexual Activity  . Alcohol use: Yes    Comment: occ  . Drug use: Never  . Sexual activity: Not on file  Other Topics Concern  . Not on file  Social History Narrative  . Not on file   Social Determinants of Health   Financial Resource Strain:   . Difficulty of Paying Living Expenses: Not on file  Food Insecurity:   . Worried About Charity fundraiser in the Last Year: Not on file  . Ran Out of Food in the Last  Year: Not on file  Transportation Needs:   . Lack of Transportation (Medical): Not on file  . Lack of Transportation (Non-Medical): Not on file  Physical Activity:   . Days of Exercise per Week: Not on file  . Minutes of Exercise per Session: Not on file  Stress:   . Feeling of Stress : Not on file  Social Connections:   . Frequency of Communication with Friends and Family: Not on file  . Frequency of Social Gatherings with Friends and Family: Not on file  . Attends Religious Services: Not on file  . Active Member of Clubs or Organizations: Not on file  . Attends Archivist Meetings: Not on file  . Marital Status: Not on file  Intimate Partner Violence:   . Fear of Current or Ex-Partner: Not on file  . Emotionally Abused: Not on file  . Physically Abused: Not on file  . Sexually Abused: Not on file     Physical Exam   Vitals:   08/02/20 1200 08/02/20 1230  BP: 119/78 (!) 121/101  Pulse: 72 78  Resp: (!) 22 (!) 22  Temp:    SpO2: 96% 96%    CONSTITUTIONAL:  well-appearing, NAD NEURO:  Alert and oriented x 3, normal and symmetric strength and sensation, subtle coordination deficit right arm with finger nose, normal  speech EYES:  eyes equal and reactive ENT/NECK:  no LAD, no JVD CARDIO:  regular rate, well-perfused, normal S1 and S2 PULM:  CTAB no wheezing or rhonchi GI/GU:  normal bowel sounds, non-distended, non-tender MSK/SPINE:  No gross deformities, no edema SKIN:  no rash, atraumatic PSYCH:  Appropriate speech and behavior  *Additional and/or pertinent findings included in MDM below  Diagnostic and Interventional Summary    EKG Interpretation  Date/Time:  Wednesday August 02 2020 09:40:42 EDT Ventricular Rate:  89 PR Interval:    QRS Duration: 111 QT Interval:  359 QTC Calculation: 437 R Axis:   27 Text Interpretation: Sinus rhythm Low voltage, precordial leads Confirmed by Gerlene Fee [phone]) on 08/02/2020 11:04:40 AM      Labs Reviewed    CBC - Abnormal; Notable for the following components:      Result Value   MCV 109.5 (*)    MCHC 29.7 (*)    All other components within normal limits  BASIC METABOLIC PANEL - Abnormal; Notable for the following components:   Glucose, Bld 101 (*)    Calcium 8.5 (*)    All other components within normal limits  URINALYSIS, ROUTINE W REFLEX MICROSCOPIC - Abnormal; Notable for the following components:   Color, Urine AMBER (*)    APPearance CLOUDY (*)    Bilirubin Urine SMALL (*)    Protein, ur 30 (*)    Leukocytes,Ua LARGE (*)    Bacteria, UA RARE (*)    Squamous Epithelial / LPF >50 (*)    All other components within normal limits  D-DIMER, QUANTITATIVE (NOT AT [facility]) - Abnormal; Notable for the following components:   D-Dimer, Quant 0.57 (*)    All other components within normal limits  FIBRINOGEN - Abnormal; Notable for the following components:   Fibrinogen 492 (*)    All other components within normal limits  C-REACTIVE PROTEIN - Abnormal; Notable for the following components:   CRP 6.2 (*)    All other components within normal limits  TROPONIN I (HIGH SENSITIVITY) - Abnormal; Notable for the following components:   Troponin I (High Sensitivity) 20 (*)    All other components within normal limits  SARS CORONAVIRUS 2 BY RT PCR (HOSPITAL ORDER, [facility])  CULTURE, BLOOD (SINGLE)  RESPIRATORY PANEL BY PCR  LACTATE DEHYDROGENASE  FERRITIN  LACTIC ACID, PLASMA  PROCALCITONIN  TRIGLYCERIDES  HIV ANTIBODY (ROUTINE TESTING W REFLEX)  STREP PNEUMONIAE URINARY ANTIGEN  LEGIONELLA PNEUMOPHILA SEROGP 1 UR AG  TROPONIN I (HIGH SENSITIVITY)    MR BRAIN WO CONTRAST  Final Result    CT HEAD WO CONTRAST  Final Result    DG Chest Port 1 View  Final Result      Medications  cefTRIAXone (ROCEPHIN) 2 g in sodium chloride 0.9 % 100 mL IVPB ( Intravenous IV Pump [employer] 08/02/20 1443)  azithromycin (ZITHROMAX) 500 mg in sodium chloride 0.9 % 250 mL  IVPB (has no administration in time range)  azithromycin (ZITHROMAX) 500 mg in sodium chloride 0.9 % 250 mL IVPB (has no administration in time range)  cefTRIAXone (ROCEPHIN) 2 g in sodium chloride 0.9 % 100 mL IVPB (has no administration in time range)  sodium chloride 0.9 % bolus 1,000 mL (1,000 mLs Intravenous New Bag/Given 08/02/20 1439)     Procedures  /  Critical Care Procedures  ED Course and Medical Decision Making  I have reviewed the triage vital signs, the nursing notes, and pertinent available records from the  EMR.  Listed above are laboratory and imaging tests that I personally ordered, reviewed, and interpreted and then considered in my medical decision making (see below for details).  Considering possible stroke, could also be due to electrolyte disturbance, dehydration, UTI.  CT head, would obtain MRI if normal.     MRI is without stroke.  Patient became hypoxic during ambulation down into the 70s, is now requiring 2 L nasal cannula to maintain saturations above 90%.  Covid is negative, chest x-ray is revealing signs of pneumonia.  Admitted to hospital service for CAP and new oxygen requirement.  Patient advised to quit smoking.  Barth Kirks. Sedonia Small, MD [place] [email or web address]  Final Clinical Impressions(s) / ED Diagnoses     ICD-10-CM   1. Community acquired pneumonia, unspecified laterality  J18.9     ED Discharge Orders    None       Discharge Instructions Discussed with and Provided to Patient:   Discharge Instructions   None       Maudie Flakes, MD 08/02/20 1505

## 2020-08-02 NOTE — ED Triage Notes (Signed)
Pt reports intermittent dizziness and weak grip bilaterally for the past 3 days.  Reports Frequent dull headaches also.   Presently rates headache at 1 or 2.  Face symmetrical.

## 2020-08-02 NOTE — ED Notes (Signed)
Pt's O2 sats decreased to 76 % while ambulating. Have applied 2L [place] to patient.

## 2020-08-03 ENCOUNTER — Inpatient Hospital Stay (HOSPITAL_COMMUNITY): Payer: Medicare Other

## 2020-08-03 LAB — CBC WITH DIFFERENTIAL/PLATELET
Abs Immature Granulocytes: 0.02 10*3/uL (ref 0.00–0.07)
Basophils Absolute: 0 10*3/uL (ref 0.0–0.1)
Basophils Relative: 1 %
Eosinophils Absolute: 0.1 10*3/uL (ref 0.0–0.5)
Eosinophils Relative: 2 %
HCT: 42.9 % (ref 36.0–46.0)
Hemoglobin: 13 g/dL (ref 12.0–15.0)
Immature Granulocytes: 0 %
Lymphocytes Relative: 23 %
Lymphs Abs: 1.5 10*3/uL (ref 0.7–4.0)
MCH: 33.5 pg (ref 26.0–34.0)
MCHC: 30.3 g/dL (ref 30.0–36.0)
MCV: 110.6 fL — ABNORMAL HIGH (ref 80.0–100.0)
Monocytes Absolute: 0.5 10*3/uL (ref 0.1–1.0)
Monocytes Relative: 8 %
Neutro Abs: 4.2 10*3/uL (ref 1.7–7.7)
Neutrophils Relative %: 66 %
Platelets: 155 10*3/uL (ref 150–400)
RBC: 3.88 MIL/uL (ref 3.87–5.11)
RDW: 14.4 % (ref 11.5–15.5)
WBC: 6.3 10*3/uL (ref 4.0–10.5)
nRBC: 0 % (ref 0.0–0.2)

## 2020-08-03 LAB — COMPREHENSIVE METABOLIC PANEL
ALT: 16 U/L (ref 0–44)
AST: 11 U/L — ABNORMAL LOW (ref 15–41)
Albumin: 3.1 g/dL — ABNORMAL LOW (ref 3.5–5.0)
Alkaline Phosphatase: 83 U/L (ref 38–126)
Anion gap: 6 (ref 5–15)
BUN: 10 mg/dL (ref 6–20)
CO2: 29 mmol/L (ref 22–32)
Calcium: 8.1 mg/dL — ABNORMAL LOW (ref 8.9–10.3)
Chloride: 105 mmol/L (ref 98–111)
Creatinine, Ser: 0.62 mg/dL (ref 0.44–1.00)
GFR calc Af Amer: >60 mL/min (ref >60)
GFR calc non Af Amer: >60 mL/min (ref >60)
Glucose, Bld: 109 mg/dL — ABNORMAL HIGH (ref 70–99)
Potassium: 4.1 mmol/L (ref 3.5–5.1)
Sodium: 140 mmol/L (ref 135–145)
Total Bilirubin: 0.9 mg/dL (ref 0.3–1.2)
Total Protein: 6.2 g/dL — ABNORMAL LOW (ref 6.5–8.1)

## 2020-08-03 MED ORDER — ACETAMINOPHEN 325 MG PO TABS
650.0000 mg | ORAL_TABLET | Freq: Four times a day (QID) | ORAL | Status: DC | PRN
  Administered 2020-08-03 – 2020-08-04 (×3): 650 mg via ORAL
  Filled 2020-08-03 (×3): qty 2

## 2020-08-03 NOTE — Evaluation (Signed)
Clinical/Bedside Swallow Evaluation Patient Details  Name: Lydia Alexander MRN: [id number] Date of Birth: 06/24/1961  Today's Date: 08/03/2020 Time: SLP Start Time (ACUTE ONLY): 0900 SLP Stop Time (ACUTE ONLY): 0924 SLP Time Calculation (min) (ACUTE ONLY): 24 min  Past Medical History:  Past Medical History:  Diagnosis Date  . Back pain   . Depression    Past Surgical History: History reviewed. No pertinent surgical history. HPI:  The patient is a 59 yr old woman who sees an PA in the [facility] for her [facility]. She states that she has no medical problems other than chronic back pain and depression. The patient states that since Saturday she has felt dizzy, has been dropping things, and didn't feel safe driving. She was initially worked up for a potential CVA in the ED, but CT head and MRI brain were negative. However the patient was found to be hypoxic at 76% on room air with ambulation. CXR demonstrated left basilar opacities.    Assessment / Plan / Recommendation Clinical Impression  Clinical swallow evaluation completed at bedside. Oral motor examination is WNL. Pt consumed ice chips, water via cup/straw, puree, and regular textures without incident. Pt exhibited no overt signs or symptoms of aspiration. She tells me that she had a "choking" episode at [facility] in May. She felt a piece of steak in her mid chest and felt she could not breathe. Someone performed the Heimlich and she swallowed the piece of steak. She also describes occasional difficulty swallowing pills. Pt is a smoker, but states she is usually not short of breath (but she also says she does not exercise). SLP can complete MBSS if MD desires, however given her symptoms, a barium pill esophagram may provide more information. Ok to continue regular textures and thin liquids with aspiration and reflux precautions, pt encouraged to masticate solids well and alternate solids/liquids as needed due to Pt report of "dry  mouth". Will message MD to request BPE. SLP will follow.   SLP Visit Diagnosis: Dysphagia, unspecified (R13.10)    Aspiration Risk  No limitations    Diet Recommendation Regular;Thin liquid   Liquid Administration via: Cup;Straw Medication Administration: Whole meds with liquid Supervision: Patient able to self feed Compensations: Follow solids with liquid (cut up and/or masticate meats well) Postural Changes: Seated upright at 90 degrees;Remain upright for at least 30 minutes after po intake    Other  Recommendations Recommended Consults: Consider GI evaluation;Consider esophageal assessment Oral Care Recommendations: Oral care BID Other Recommendations: Clarify dietary restrictions   Follow up Recommendations None      Frequency and Duration min 2x/week  1 week       Prognosis Prognosis for Safe Diet Advancement: Good      Swallow Study   General Date of Onset: 08/02/20 HPI: The patient is a 59 yr old woman who sees an PA in the [facility] for her regular [facility]. She states that she has no medical problems other than chronic back pain and depression. The patient states that since Saturday she has felt dizzy, has been dropping things, and didn't feel safe driving. She was initially worked up for a potential CVA in the ED, but CT head and MRI brain were negative. However the patient was found to be hypoxic at 76% on room air with ambulation. CXR demonstrated left basilar opacities.  Type of Study: Bedside Swallow Evaluation Previous Swallow Assessment: None on record Diet Prior to this Study: Regular;Thin liquids Temperature Spikes Noted: No Respiratory Status: Nasal cannula  History of Recent Intubation: No Behavior/Cognition: Alert;Cooperative;Pleasant mood Oral Cavity Assessment: Within Functional Limits Oral Care Completed by SLP: No Oral Cavity - Dentition: Adequate natural dentition;Poor condition Vision: Functional for self-feeding Self-Feeding Abilities:  Able to feed self Patient Positioning: Upright in bed Baseline Vocal Quality: Normal Volitional Cough: Strong Volitional Swallow: Able to elicit    Oral/Motor/Sensory Function Overall Oral Motor/Sensory Function: Within functional limits   Ice Chips Ice chips: Within functional limits Presentation: Spoon   Thin Liquid Thin Liquid: Within functional limits Presentation: Cup;Straw;Self Fed    Nectar Thick Nectar Thick Liquid: Not tested   Honey Thick Honey Thick Liquid: Not tested   Puree Puree: Within functional limits Presentation: Self Fed;Spoon   Solid     Solid: Within functional limits Presentation: Self Fed     Thank you,  Lydia Alexander, [place]  Steen 08/03/2020,9:52 AM

## 2020-08-03 NOTE — Progress Notes (Addendum)
PROGRESS NOTE    Lydia Alexander  XQJ:[id number] DOB: 09/07/1961 DOA: 08/02/2020 PCP: Bridget Hartshorn, NP   Brief Narrative:  Patient is a 59 year old female with history of chronic back pain, depression who presents to the emergency department complaints of dizziness while at work.  Stroke was suspected on presentation.  MRI of the brain was negative for any acute intracranial abnormalities.  Patient was also found to be hypoxic at 76% on room air with ambulation.  Chest x-ray showed left basilar opacity.  Patient was admitted for the management of community-acquired pneumonia versus aspiration pneumonia and was started on antibiotics.  Speech therapy following, plan for MBS today.    Assessment & Plan:   Principal Problem:   Acute respiratory failure with hypoxia (HCC) Active Problems:   Community acquired pneumonia   Peripheral edema   Chronic back pain   Acute hypoxic respiratory failure:  No history of asthma or COPD.  Hypoxic at 76% on room air on presentation.  Currently needing 2 L of oxygen per minute.  We will try to wean the oxygen.  She is not on oxygen at home.  Community-acquired pneumonia: Chest x-ray showed left basilar opacity .started on azithromycin and ceftriaxone for community-acquired pneumonia.  Cannot rule out aspiration pneumonia.  Speech therapy following, she has history of choking spells.  Plan for barium esophagram.  Dizziness: Continue meclizine as needed.  Currently no complaints of dizziness.  Will request for PT evaluation.  MRI of the brain did not show any acute intracranial abnormalities.  Anxiety disorder: On Xanax at home.  Hyperlipidemia: Takes Lipitor 10 mg at home.  Chronic back pain: On gabapentin.  Continue supportive care.  PT evaluation requested.  Nicotine dependence: Smokes a pack a day.  Consult physician.  Continue nicotine patch.  Headache: Continue Tylenol.  Morbid obesity: BMI of 39.9         DVT prophylaxis:  Lovenox Code Status: Full Family Communication: None present at the bedside Status is: Inpatient  Remains inpatient appropriate because:Inpatient level of care appropriate due to severity of illness   Dispo: The patient is from: Home              Anticipated d/c is to: Home              Anticipated d/c date is: 1 day              Patient currently is not medically stable to d/c.     Consultants: None  Procedures: None  Antimicrobials:  Anti-infectives (From admission, onward)   Start     Dose/Rate Route Frequency Ordered Stop   08/03/20 1500  azithromycin (ZITHROMAX) 500 mg in sodium chloride 0.9 % 250 mL IVPB        500 mg 250 mL/hr over 60 Minutes Intravenous Every 24 hours 08/02/20 1448 08/07/20 1459   08/03/20 1500  cefTRIAXone (ROCEPHIN) 2 g in sodium chloride 0.9 % 100 mL IVPB        2 g 200 mL/hr over 30 Minutes Intravenous Every 24 hours 08/02/20 1448 08/07/20 1459   08/03/20 1500  azithromycin (ZITHROMAX) 500 mg in sodium chloride 0.9 % 250 mL IVPB  Status:  Discontinued        500 mg 250 mL/hr over 60 Minutes Intravenous Every 24 hours 08/02/20 1513 08/02/20 1515   08/02/20 1515  azithromycin (ZITHROMAX) 500 mg in sodium chloride 0.9 % 250 mL IVPB        500 mg 250 mL/hr over 60  Minutes Intravenous  Once 08/02/20 1510 08/02/20 1636   08/02/20 1500  cefTRIAXone (ROCEPHIN) 2 g in sodium chloride 0.9 % 100 mL IVPB  Status:  Discontinued        2 g 200 mL/hr over 30 Minutes Intravenous Every 24 hours 08/02/20 1447 08/02/20 1448   08/02/20 1500  azithromycin (ZITHROMAX) 500 mg in sodium chloride 0.9 % 250 mL IVPB  Status:  Discontinued        500 mg 250 mL/hr over 60 Minutes Intravenous Every 24 hours 08/02/20 1447 08/02/20 1448   08/02/20 1430  cefTRIAXone (ROCEPHIN) 2 g in sodium chloride 0.9 % 100 mL IVPB        2 g 200 mL/hr over 30 Minutes Intravenous  Once 08/02/20 1420 08/02/20 1524   08/02/20 1430  azithromycin (ZITHROMAX) 500 mg in sodium chloride 0.9 % 250  mL IVPB  Status:  Discontinued        500 mg 250 mL/hr over 60 Minutes Intravenous Every 24 hours 08/02/20 1420 08/02/20 1510      Subjective:  Patient seen and examined the bedside this morning.  Medically stable.  Currently on 2 L of oxygen per minute.  Does not complain of any shortness of breath or cough.  Not in respiratory distress.  Complains of some headache today.  Dizziness has improved.  Objective: Vitals:   08/02/20 1635 08/02/20 2052 08/03/20 0240 08/03/20 0644  BP: (!) 109/95 (!) 141/82 (!) 120/97 116/77  Pulse: 73 75 76 75  Resp: 20 20 16 18   Temp: 98.1 F (36.7 C) 98.7 F (37.1 C) 97.8 F (36.6 C) 97.9 F (36.6 C)  TempSrc: Oral   Oral  SpO2: 99% 96% 96% 93%  Weight: 102.2 kg     Height: 5' 3"  (1.6 m)       Intake/Output Summary (Last 24 hours) at 08/03/2020 1055 Last data filed at 08/03/2020 0900 Gross per 24 hour  Intake 416.69 ml  Output --  Net 416.69 ml   Filed Weights   08/02/20 0934 08/02/20 1635  Weight: 101.2 kg 102.2 kg    Examination:  General exam: Pleasant female, obese HEENT:PERRL,Oral mucosa moist, Ear/Nose normal on gross exam Respiratory system: Bilateral diminished air sounds on the bases, no crackles or wheezes.   Cardiovascular system: S1 & S2 heard, RRR. No JVD, murmurs, rubs, gallops or clicks. No pedal edema. Gastrointestinal system: Abdomen is nondistended, soft and nontender. No organomegaly or masses felt. Normal bowel sounds heard. Central nervous system: Alert and oriented. No focal neurological deficits. Extremities: No edema, no clubbing ,no cyanosis, distal peripheral pulses palpable. Skin: No rashes, lesions or ulcers,no icterus ,no pallor   Data Reviewed: I have personally reviewed following labs and imaging studies  CBC: Recent Labs  Lab 08/02/20 1054 08/03/20 0615  WBC 7.3 6.3  NEUTROABS  --  4.2  HGB 13.0 13.0  HCT 43.7 42.9  MCV 109.5* 110.6*  PLT 173 093   Basic Metabolic Panel: Recent Labs  Lab  08/02/20 1054 08/03/20 0615  NA 140 140  K 3.8 4.1  CL 103 105  CO2 29 29  GLUCOSE 101* 109*  BUN 12 10  CREATININE 0.74 0.62  CALCIUM 8.5* 8.1*   GFR: Estimated Creatinine Clearance: 85.4 mL/min (by C-G formula based on SCr of 0.62 mg/dL). Liver Function Tests: Recent Labs  Lab 08/03/20 0615  AST 11*  ALT 16  ALKPHOS 83  BILITOT 0.9  PROT 6.2*  ALBUMIN 3.1*   No results for input(s): LIPASE,  AMYLASE in the last 168 hours. No results for input(s): AMMONIA in the last 168 hours. Coagulation Profile: No results for input(s): INR, PROTIME in the last 168 hours. Cardiac Enzymes: No results for input(s): CKTOTAL, CKMB, CKMBINDEX, TROPONINI in the last 168 hours. BNP (last 3 results) No results for input(s): PROBNP in the last 8760 hours. HbA1C: No results for input(s): HGBA1C in the last 72 hours. CBG: No results for input(s): GLUCAP in the last 168 hours. Lipid Profile: Recent Labs    08/02/20 1330  TRIG 77   Thyroid Function Tests: No results for input(s): TSH, T4TOTAL, FREET4, T3FREE, THYROIDAB in the last 72 hours. Anemia Panel: Recent Labs    08/02/20 1330  FERRITIN 25   Sepsis Labs: Recent Labs  Lab 08/02/20 1330  PROCALCITON <0.10  LATICACIDVEN 0.7    Recent Results (from the past 240 hour(s))  SARS Coronavirus 2 by RT PCR (hospital order, performed in [facility] hospital lab) Nasopharyngeal Nasopharyngeal Swab     Status: None   Collection Time: 08/02/20 12:43 PM   Specimen: Nasopharyngeal Swab  Result Value Ref Range Status   SARS Coronavirus 2 NEGATIVE NEGATIVE Final    Comment: (NOTE) SARS-CoV-2 target nucleic acids are NOT DETECTED.  The SARS-CoV-2 RNA is generally detectable in upper and lower respiratory specimens during the acute phase of infection. The lowest concentration of SARS-CoV-2 viral copies this assay can detect is 250 copies / mL. A negative result does not preclude SARS-CoV-2 infection and should not be used as the sole  basis for treatment or other patient management decisions.  A negative result may occur with improper specimen collection / handling, submission of specimen other than nasopharyngeal swab, presence of viral mutation(s) within the areas targeted by this assay, and inadequate number of viral copies (<250 copies / mL). A negative result must be combined with clinical observations, patient history, and epidemiological information.  Fact Sheet for Patients:   StrictlyIdeas.no  Fact Sheet for Healthcare Providers: BankingDealers.co.za  This test is not yet approved or  cleared by the [place] FDA and has been authorized for detection and/or diagnosis of SARS-CoV-2 by FDA under an Emergency Use Authorization (EUA).  This EUA will remain in effect (meaning this test can be used) for the duration of the COVID-19 declaration under Section 564(b)(1) of the Act, 21 U.S.C. section 360bbb-3(b)(1), unless the authorization is terminated or revoked sooner.  Performed at [facility], [address], [place], [place] [address]   Culture, blood (single) w Reflex to ID Panel     Status: None (Preliminary result)   Collection Time: 08/02/20  1:31 PM   Specimen: Left Antecubital; Blood  Result Value Ref Range Status   Specimen Description LEFT ANTECUBITAL  Final   Special Requests   Final    BOTTLES DRAWN AEROBIC AND ANAEROBIC Blood Culture adequate volume   Culture   Final    NO GROWTH < 24 HOURS Performed at [facility], [address] [place], [place] [address]    Report Status PENDING  Incomplete  Culture, blood (routine x 2)     Status: None (Preliminary result)   Collection Time: 08/02/20  6:38 PM   Specimen: Left Antecubital; Blood  Result Value Ref Range Status   Specimen Description LEFT ANTECUBITAL  Final   Special Requests   Final    BOTTLES DRAWN AEROBIC AND ANAEROBIC Blood Culture adequate volume   Culture   Final    NO GROWTH <  12 HOURS Performed at [facility], [address] [place], [place] [address]    Report Status PENDING  Incomplete  Culture, blood (routine x 2)     Status: None (Preliminary result)   Collection Time: 08/02/20  6:38 PM   Specimen: BLOOD LEFT HAND  Result Value Ref Range Status   Specimen Description BLOOD LEFT HAND  Final   Special Requests   Final    BOTTLES DRAWN AEROBIC AND ANAEROBIC Blood Culture adequate volume   Culture   Final    NO GROWTH < 12 HOURS Performed at [facility], [address] [place], [place] [address]    Report Status PENDING  Incomplete         Radiology Studies: CT HEAD WO CONTRAST  Result Date: 08/02/2020 CLINICAL DATA:  Vertigo, central. Additional history provided: Patient reports intermittent dizziness and weak grip bilaterally for the past 3 days. EXAM: CT HEAD WITHOUT CONTRAST TECHNIQUE: Contiguous axial images were obtained from the base of the skull through the vertex without intravenous contrast. COMPARISON:  No pertinent prior exams are available for comparison. FINDINGS: Brain: Cerebral volume is normal. There is no acute intracranial hemorrhage. No demarcated cortical infarct. No extra-axial fluid collection. No evidence of intracranial mass. No midline shift. Vascular: No hyperdense vessel. Skull: Normal. Negative for fracture or focal suspicious lesion. Sinuses/Orbits: Visualized orbits show no acute finding. No significant paranasal sinus disease or mastoid effusion at the imaged levels. IMPRESSION: Unremarkable non-contrast CT appearance of the brain for age. No evidence of acute intracranial abnormality. Electronically Signed   By: Kellie Simmering DO   On: 08/02/2020 12:22   MR BRAIN WO CONTRAST  Result Date: 08/02/2020 CLINICAL DATA:  Neuro deficit, acute, stroke suspected. Additional history provided: Patient reports intermittent dizziness and weak grip bilaterally for the past 3 days. EXAM: MRI HEAD WITHOUT CONTRAST TECHNIQUE: Multiplanar,  multiecho pulse sequences of the brain and surrounding structures were obtained without intravenous contrast. COMPARISON:  Noncontrast head CT performed earlier the same day 08/02/2020. FINDINGS: Brain: Cerebral volume is normal for age. Mild multifocal T2/FLAIR hyperintensity within the cerebral white matter and pons is nonspecific, but consistent with chronic small vessel ischemic disease. There is no acute infarct. No evidence of intracranial mass. No chronic intracranial blood products. No extra-axial fluid collection. No midline shift. Vascular: Expected proximal arterial flow voids. Skull and upper cervical spine: No focal marrow lesion. Sinuses/Orbits: Visualized orbits show no acute finding. Trace ethmoid sinus mucosal thickening. IMPRESSION: No evidence of acute intracranial abnormality, including acute infarction. Mild chronic small vessel ischemic changes within the cerebral white matter and pons. Electronically Signed   By: Kellie Simmering DO   On: 08/02/2020 13:39   DG Chest Port 1 View  Result Date: 08/02/2020 CLINICAL DATA:  Hypoxia EXAM: PORTABLE CHEST 1 VIEW COMPARISON:  None. FINDINGS: Prominence of the cardiomediastinal silhouette may be secondary to mild hypoinflation and AP technique. Patchy left basilar opacities. No pneumothorax or pleural effusion. No acute osseous abnormality. IMPRESSION: Left basilar opacities are concerning for infection. Electronically Signed   By: Primitivo Gauze M.D.   On: 08/02/2020 11:13        Scheduled Meds: . ALPRAZolam  1 mg Oral BID  . atorvastatin  10 mg Oral QHS  . gabapentin  600 mg Oral TID  . nicotine  21 mg Transdermal Daily  . [START ON 08/08/2020] Vitamin D (Ergocalciferol)  50,000 Units Oral Q7 days   Continuous Infusions: . azithromycin    . cefTRIAXone (ROCEPHIN)  IV  LOS: 1 day    Time spent: 25 mins,More than 50% of that time was spent in counseling and/or coordination of care.      Shelly Coss, MD [facility] P9/23/2021, 10:55 AM

## 2020-08-03 NOTE — Progress Notes (Addendum)
PT Cancellation Note  Patient Details Name: Lydia Alexander MRN: [id number] DOB: [birth year]   Cancelled Treatment:    Reason Eval/Treat Not Completed: Patient at procedure or test/unavailable. PT will recheck tomorrow.   Floria Raveling. Hartnett-Rands, MS, PT Per [place] [phone] 08/03/2020, 3:13 PM

## 2020-08-04 ENCOUNTER — Telehealth: Payer: Self-pay | Admitting: Nurse Practitioner

## 2020-08-04 DIAGNOSIS — R1310 Dysphagia, unspecified: Secondary | ICD-10-CM

## 2020-08-04 DIAGNOSIS — R609 Edema, unspecified: Secondary | ICD-10-CM

## 2020-08-04 DIAGNOSIS — R1319 Other dysphagia: Secondary | ICD-10-CM

## 2020-08-04 DIAGNOSIS — J9601 Acute respiratory failure with hypoxia: Secondary | ICD-10-CM

## 2020-08-04 DIAGNOSIS — J189 Pneumonia, unspecified organism: Principal | ICD-10-CM

## 2020-08-04 MED ORDER — PANTOPRAZOLE SODIUM 40 MG PO TBEC
40.0000 mg | DELAYED_RELEASE_TABLET | Freq: Every day | ORAL | Status: DC
  Administered 2020-08-04 – 2020-08-05 (×2): 40 mg via ORAL
  Filled 2020-08-04 (×3): qty 1

## 2020-08-04 MED ORDER — ENOXAPARIN SODIUM 60 MG/0.6ML ~~LOC~~ SOLN
50.0000 mg | SUBCUTANEOUS | Status: DC
  Administered 2020-08-04 – 2020-08-05 (×2): 50 mg via SUBCUTANEOUS
  Filled 2020-08-04 (×2): qty 0.6

## 2020-08-04 MED ORDER — MECLIZINE HCL 12.5 MG PO TABS: 12.5000 mg | ORAL_TABLET | Freq: Three times a day (TID) | ORAL | Status: DC | PRN

## 2020-08-04 NOTE — Consult Note (Signed)
Referring Provider: [facility] Primary Care Physician:  Bridget Hartshorn, NP Primary Gastroenterologist:  Dr. Abbey Chatters (previously unassigned)  Date of Admission: 08/02/20 Date of Consultation: 08/04/20  Reason for Consultation:  dysphagia  HPI:  Lydia Alexander is a 59 y.o. female with a past medical history of chronic back pain and depression who presented to the emergency department for dizziness.  Initial suspected stroke but MRI of the brain negative for any acute abnormalities.  Patient was found to be hypoxic 76% on room air with ambulation with chest x-ray demonstrating left basilar opacity.  The patient was admitted for management of likely community-acquired pneumonia versus aspiration pneumonia and was started on antibiotics.  No history of GI evaluation in our system.  No history of endoscopy or colonoscopy in our system.  The patient had a planned SLP swallow evaluation.  However noted during her exam that she has a history of solid food dysphagia and pill dysphagia.  She had a "choking" episode in May of this year at [employer] with a piece of steak becoming stuck in her esophagus but eventually passed after someone attempted Heimlich maneuver.  Also with pill dysphagia occasionally.  Given her history, speech-language pathology recommended barium pill esophagram versus bedside swallow evaluation.  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  Given her history the question was raised on whether her pneumonia was potentially aspiration pneumonia rather than community-acquired pneumonia.  GI was consulted for possible endoscopic evaluation and possible dilation.  Today she states she is doing okay overall.  The patient states she has had dysphagia symptoms since approximately April or May of this year.  She did have an unfortunate episode when she was  out eating that food got stuck requiring Heimlich maneuver as it felt like it was blocking her windpipe.  She states she has solid food dysphagia without regurgitation ("mostly just goes down little slower or I have to cough to bring it up to swallow it") which occurs about a couple times a week.  Her bigger issue is pill dysphagia which happens about every 2 or 3 times she has to take pills.  She does keep ample water on hand.  Denies overt GERD symptoms.  No abdominal pain, nausea, vomiting.  States her breathing is doing a bit better.  She has eaten today (tray was on her bedside table and food had apparently been consumed).  No other overt GI complaints.  Past Medical History:  Diagnosis Date  . Back pain   . Depression     History reviewed. No pertinent surgical history.  Prior to Admission medications   Medication Sig Start Date End Date Taking? Authorizing Provider  ALPRAZolam Duanne Moron) 1 MG tablet Take 1 mg by mouth 2 (two) times daily. 07/21/20  Yes [provider]  atorvastatin (LIPITOR) 10 MG tablet Take 10 mg by mouth at bedtime. 06/16/20 09/14/20 Yes [provider]  cyclobenzaprine (FLEXERIL) 10 MG tablet Take 10 mg by mouth 3 (three) times daily as needed (Muscle spasms).  05/30/20  Yes [provider]  gabapentin (NEURONTIN) 300 MG capsule Take 600 mg by mouth in the morning, at noon, and at bedtime.  06/05/17 08/23/20 Yes [provider]  naproxen (NAPROSYN) 500 MG tablet Take 500 mg by mouth daily. 05/24/20  Yes [provider]  Vitamin D, Ergocalciferol, (DRISDOL) 1.25 MG (50000 UNIT) CAPS capsule Take 50,000 Units by mouth once a week. 06/22/20  Yes [provider]    Current Facility-Administered Medications  Medication Dose Route Frequency Provider Last Rate Last Admin  . acetaminophen (TYLENOL) tablet 650 mg  650 mg Oral Q6H PRN Shelly Coss, MD   650 mg at 08/04/20 0547  . ALPRAZolam Duanne Moron) tablet 1 mg  1 mg Oral BID  Lang Snow, FNP   1 mg at 08/03/20 2147  . atorvastatin (LIPITOR) tablet 10 mg  10 mg Oral QHS Lang Snow, FNP   10 mg at 08/03/20 2147  . azithromycin (ZITHROMAX) 500 mg in sodium chloride 0.9 % 250 mL IVPB  500 mg Intravenous Q24H Swayze, Ava, DO 250 mL/hr at 08/03/20 1418 500 mg at 08/03/20 1418  . cefTRIAXone (ROCEPHIN) 2 g in sodium chloride 0.9 % 100 mL IVPB  2 g Intravenous Q24H Swayze, Ava, DO 200 mL/hr at 08/03/20 1436 2 g at 08/03/20 1436  . cyclobenzaprine (FLEXERIL) tablet 10 mg  10 mg Oral TID PRN Lang Snow, FNP   10 mg at 08/04/20 0547  . gabapentin (NEURONTIN) capsule 600 mg  600 mg Oral TID Lang Snow, FNP   600 mg at 08/03/20 2147  . meclizine (ANTIVERT) tablet 12.5 mg  12.5 mg Oral TID PRN Shelly Coss, MD      . nicotine (NICODERM CQ - dosed in mg/24 hours) patch 21 mg  21 mg Transdermal Daily Swayze, Ava, DO   21 mg at 08/03/20 0919  . [START ON 08/08/2020] Vitamin D (Ergocalciferol) (DRISDOL) capsule 50,000 Units  50,000 Units Oral Q7 days Lang Snow, FNP        Allergies as of 08/02/2020  . (No Known Allergies)    No family history on file.  Social History   Socioeconomic History  . Marital status: Widowed    Spouse name: Not on file  . Number of children: Not on file  . Years of education: Not on file  . Highest education level: Not on file  Occupational History  . Not on file  Tobacco Use  . Smoking status: Current Every Day Smoker  . Smokeless tobacco: Never Used  Substance and Sexual Activity  . Alcohol use: Yes    Comment: occ  . Drug use: Never  . Sexual activity: Not on file  Other Topics Concern  . Not on file  Social History Narrative  . Not on file   Social Determinants of Health   Financial Resource Strain:   . Difficulty of Paying Living Expenses: Not on file  Food Insecurity:   . Worried About Charity fundraiser in the Last Year: Not on file  . Ran Out of Food in the Last Year: Not on file  Transportation Needs:    . Lack of Transportation (Medical): Not on file  . Lack of Transportation (Non-Medical): Not on file  Physical Activity:   . Days of Exercise per Week: Not on file  . Minutes of Exercise per Session: Not on file  Stress:   . Feeling of Stress : Not on file  Social Connections:   . Frequency of Communication with Friends and Family: Not on file  . Frequency of Social Gatherings with Friends and Family: Not on file  . Attends Religious Services: Not on file  . Active Member of Clubs or Organizations: Not on file  . Attends Archivist Meetings: Not on file  . Marital Status: Not on file  Intimate Partner Violence:   . Fear of Current or Ex-Partner: Not on file  . Emotionally  Abused: Not on file  . Physically Abused: Not on file  . Sexually Abused: Not on file    Review of Systems: General: Negative for anorexia, weight loss, fever, chills, fatigue, weakness. ENT: Negative for hoarseness. Admits solid food and pill dysphagia. CV: Negative for chest pain, angina, palpitations, dyspnea on exertion, peripheral edema.  Respiratory: Negative for dyspnea at rest,. Presenting with DOE, cough.  GI: See history of present illness. GU:  Negative for dysuria, hematuria, urinary incontinence, urinary frequency, nocturnal urination.  MS: Negative for joint pain, low back pain.  Derm: Negative for rash or itching.  Neuro: Admitted weakness and dizziness leading up to hospital presentation.  Psych: Negative for anxiety, depression, suicidal ideation, hallucinations.  Allergy: Negative for rash or hives.  Physical Exam: Vital signs in last 24 hours: Temp:  [98 F (36.7 C)-98.3 F (36.8 C)] 98 F (36.7 C) (09/24 0544) Pulse Rate:  [71-83] 83 (09/24 0544) Resp:  [16-18] 18 (09/24 0544) BP: (123-131)/(77-80) 131/77 (09/24 0544) SpO2:  [93 %-97 %] 93 % (09/24 0544) Last BM Date: 08/01/20 General:   Alert,  Well-developed, well-nourished, pleasant and cooperative in NAD Head:   Normocephalic and atraumatic. Eyes:  Sclera clear, no icterus. Conjunctiva pink. Ears:  Normal auditory acuity. Neck:  Supple; no masses or thyromegaly. Lungs:  Clear throughout to auscultation. No wheezes, crackles, or rhonchi. No acute distress. Heart:  Regular rate and rhythm; no murmurs, clicks, rubs,  or gallops. Abdomen:  Rounded but soft, nontender and nondistended. No masses, hepatosplenomegaly or hernias noted. Normal bowel sounds, without guarding, and without rebound.   Rectal:  Deferred.   Msk:  Symmetrical without gross deformities. Pulses:  Normal bilateral DP pulses noted. Extremities:  Without clubbing or edema. Neurologic:  Alert and  oriented x4;  grossly normal neurologically. Psych:  Alert and cooperative. Normal mood and affect.  Intake/Output from previous day: 09/23 0701 - 09/24 0700 In: 1153 [P.O.:800; IV Piggyback:353] Out: -  Intake/Output this shift: No intake/output data recorded.  Lab Results: Recent Labs    08/02/20 1054 08/03/20 0615  WBC 7.3 6.3  HGB 13.0 13.0  HCT 43.7 42.9  PLT 173 155   BMET Recent Labs    08/02/20 1054 08/03/20 0615  NA 140 140  K 3.8 4.1  CL 103 105  CO2 29 29  GLUCOSE 101* 109*  BUN 12 10  CREATININE 0.74 0.62  CALCIUM 8.5* 8.1*   LFT Recent Labs    08/03/20 0615  PROT 6.2*  ALBUMIN 3.1*  AST 11*  ALT 16  ALKPHOS 83  BILITOT 0.9   PT/INR No results for input(s): LABPROT, INR in the last 72 hours. Hepatitis Panel No results for input(s): HEPBSAG, HCVAB, HEPAIGM, HEPBIGM in the last 72 hours. C-Diff No results for input(s): CDIFFTOX in the last 72 hours.  Studies/Results: CT HEAD WO CONTRAST  Result Date: 08/02/2020 CLINICAL DATA:  Vertigo, central. Additional history provided: Patient reports intermittent dizziness and weak grip bilaterally for the past 3 days. EXAM: CT HEAD WITHOUT CONTRAST TECHNIQUE: Contiguous axial images were obtained from the base of the skull through the vertex without  intravenous contrast. COMPARISON:  No pertinent prior exams are available for comparison. FINDINGS: Brain: Cerebral volume is normal. There is no acute intracranial hemorrhage. No demarcated cortical infarct. No extra-axial fluid collection. No evidence of intracranial mass. No midline shift. Vascular: No hyperdense vessel. Skull: Normal. Negative for fracture or focal suspicious lesion. Sinuses/Orbits: Visualized orbits show no acute finding. No significant paranasal sinus disease  or mastoid effusion at the imaged levels. IMPRESSION: Unremarkable non-contrast CT appearance of the brain for age. No evidence of acute intracranial abnormality. Electronically Signed   By: Kellie Simmering DO   On: 08/02/2020 12:22   MR BRAIN WO CONTRAST  Result Date: 08/02/2020 CLINICAL DATA:  Neuro deficit, acute, stroke suspected. Additional history provided: Patient reports intermittent dizziness and weak grip bilaterally for the past 3 days. EXAM: MRI HEAD WITHOUT CONTRAST TECHNIQUE: Multiplanar, multiecho pulse sequences of the brain and surrounding structures were obtained without intravenous contrast. COMPARISON:  Noncontrast head CT performed earlier the same day 08/02/2020. FINDINGS: Brain: Cerebral volume is normal for age. Mild multifocal T2/FLAIR hyperintensity within the cerebral white matter and pons is nonspecific, but consistent with chronic small vessel ischemic disease. There is no acute infarct. No evidence of intracranial mass. No chronic intracranial blood products. No extra-axial fluid collection. No midline shift. Vascular: Expected proximal arterial flow voids. Skull and upper cervical spine: No focal marrow lesion. Sinuses/Orbits: Visualized orbits show no acute finding. Trace ethmoid sinus mucosal thickening. IMPRESSION: No evidence of acute intracranial abnormality, including acute infarction. Mild chronic small vessel ischemic changes within the cerebral white matter and pons. Electronically Signed   By:  Kellie Simmering DO   On: 08/02/2020 13:39   DG Chest Port 1 View  Result Date: 08/02/2020 CLINICAL DATA:  Hypoxia EXAM: PORTABLE CHEST 1 VIEW COMPARISON:  None. FINDINGS: Prominence of the cardiomediastinal silhouette may be secondary to mild hypoinflation and AP technique. Patchy left basilar opacities. No pneumothorax or pleural effusion. No acute osseous abnormality. IMPRESSION: Left basilar opacities are concerning for infection. Electronically Signed   By: Primitivo Gauze M.D.   On: 08/02/2020 11:13   DG ESOPHAGUS W DOUBLE CM (HD)  Result Date: 08/03/2020 CLINICAL DATA:  59 year old female with history of dysphagia. EXAM: ESOPHOGRAM / BARIUM SWALLOW / BARIUM TABLET STUDY TECHNIQUE: Combined double contrast and single contrast examination performed using effervescent crystals, thick barium liquid, and thin barium liquid. The patient was observed with fluoroscopy swallowing a 13 mm barium sulphate tablet. FLUOROSCOPY TIME:  Fluoroscopy Time: 2 minutes and 42 seconds Radiation Exposure Index (if provided by the fluoroscopic device): 24.4 mGy Number of Acquired Spot Images: 0 COMPARISON:  None. FINDINGS: Double contrast images of the esophagus demonstrate a normal appearance of the esophageal mucosa. Multiple single swallow attempts were observed which demonstrated failure to propagate a primary peristaltic wave, with extensive tertiary contractions. Full column esophagram demonstrated normal esophageal anatomy. Specifically, no hiatal hernia, and no evidence of esophageal mass, stricture or esophageal ring. A barium tablet was administered, however, the patient was unable to successfully swallow the tablet. IMPRESSION: 1. 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 Electronically Signed   By: Vinnie Langton M.D.   On: 08/03/2020 16:53    Impression: Very pleasant 59 year old female presented for weakness, dizziness, dyspnea on exertion.  Found to have left basilar  opacities and hypoxia on presentation deemed likely community-acquired pneumonia.  Patient was previously having solid food and pill dysphagia.  Bedside swallow eval was ordered but deferred to barium pill esophagram which found no definite structural abnormalities but with functional limitations.  Requested GI consult for possible upper endoscopy with dilation.  Dysphagia - ongoing for about the past 5 to 6 months, one episode of significant dysphagia requiring Heimlich maneuver to remove food.  Has solid food dysphagia a couple times a week and pill dysphagia about once every 1 to 2 days.  No regurgitation.  Denies overt  GERD symptoms.  Has never had endoscopy before.  Given her admission for pneumonia and hypoxia, although currently satting well on 2 L, it would likely be best to hold off for endoscopic evaluation until she is over her acute illness.  Plan: 1. Dysphagia 3 diet for now and at discharge 2. Avoid over cooked meats, dry breads 3. Adequate chewing when eating 4. Start Protonix 40 mg once daily now and at discharge 5. We will schedule her for outpatient endoscopy with possible dilation on triage after discharge 6. Supportive care   Thank you for allowing [place] to participate in the care of Bieber, DNP, AGNP-C Adult & Gerontological Nurse Practitioner [facility]   LOS: 2 days     08/04/2020, 8:30 AM

## 2020-08-04 NOTE — Progress Notes (Signed)
PROGRESS NOTE    Lydia Alexander  RAQ:[id number] DOB: 06-28-1961 DOA: 08/02/2020 PCP: Bridget Hartshorn, NP   Brief Narrative:  Patient is a 59 year old female with history of chronic back pain, depression who presents to the emergency department complaints of dizziness while at work.  Stroke was suspected on presentation.  MRI of the brain was negative for any acute intracranial abnormalities.  Patient was also found to be hypoxic at 76% on room air with ambulation.  Chest x-ray showed left basilar opacity.  Patient was admitted for the management of community-acquired pneumonia versus aspiration pneumonia and was started on antibiotics.  Hospital course remarkable for persistent requirement of oxygen.   Assessment & Plan:   Principal Problem:   Acute respiratory failure with hypoxia (HCC) Active Problems:   Community acquired pneumonia   Peripheral edema   Chronic back pain   Acute hypoxic respiratory failure:  No history of asthma or COPD.  Hypoxic at 76% on room air on presentation.  Currently needing 2 L of oxygen per minute.  We will try to wean the oxygen.  She is not on oxygen at home.  If she continues to require oxygen, she might qualify for home oxygen.  Will check tomorrow.  Community-acquired pneumonia: Chest x-ray showed left basilar opacity .started on azithromycin and ceftriaxone for community-acquired pneumonia.  Cannot rule out aspiration pneumonia.    Dysphagia: Speech therapy following, she has history of choking spells.  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  GI consulted, plan for EGD as an outpatient..  Dizziness: Continue meclizine as needed.  Currently no complaints of dizziness.  Will request for PT evaluation.  MRI of the brain did not show any acute intracranial abnormalities.  Anxiety disorder: On Xanax at home.  Hyperlipidemia: Takes Lipitor 10 mg at home.  Chronic back pain:  On gabapentin.  Continue supportive care.  PT evaluation requested.  Nicotine dependence: Smokes a pack a day.  Counseled for cessation..  Continue nicotine patch.  Headache: Continue Tylenol.  Morbid obesity: BMI of 39.9         DVT prophylaxis: Lovenox Code Status: Full Family Communication: Called and discussed with daughter-in-law on phone Status is: Inpatient  Remains inpatient appropriate because:Inpatient level of care appropriate due to severity of illness   Dispo: The patient is from: Home              Anticipated d/c is to: Home              Anticipated d/c date is: 1 day              Patient currently is not medically stable to d/c. Still requiring 2 to 3 L of oxygen per minute.  Trying to wean the oxygen.    Consultants: None  Procedures: None  Antimicrobials:  Anti-infectives (From admission, onward)   Start     Dose/Rate Route Frequency Ordered Stop   08/03/20 1500  azithromycin (ZITHROMAX) 500 mg in sodium chloride 0.9 % 250 mL IVPB        500 mg 250 mL/hr over 60 Minutes Intravenous Every 24 hours 08/02/20 1448 08/07/20 1459   08/03/20 1500  cefTRIAXone (ROCEPHIN) 2 g in sodium chloride 0.9 % 100 mL IVPB        2 g 200 mL/hr over 30 Minutes Intravenous Every 24 hours 08/02/20 1448 08/07/20 1459   08/03/20 1500  azithromycin (ZITHROMAX) 500 mg in sodium chloride 0.9 % 250 mL IVPB  Status:  Discontinued        500 mg 250 mL/hr over 60 Minutes Intravenous Every 24 hours 08/02/20 1513 08/02/20 1515   08/02/20 1515  azithromycin (ZITHROMAX) 500 mg in sodium chloride 0.9 % 250 mL IVPB        500 mg 250 mL/hr over 60 Minutes Intravenous  Once 08/02/20 1510 08/02/20 1636   08/02/20 1500  cefTRIAXone (ROCEPHIN) 2 g in sodium chloride 0.9 % 100 mL IVPB  Status:  Discontinued        2 g 200 mL/hr over 30 Minutes Intravenous Every 24 hours 08/02/20 1447 08/02/20 1448   08/02/20 1500  azithromycin (ZITHROMAX) 500 mg in sodium chloride 0.9 % 250 mL IVPB  Status:   Discontinued        500 mg 250 mL/hr over 60 Minutes Intravenous Every 24 hours 08/02/20 1447 08/02/20 1448   08/02/20 1430  cefTRIAXone (ROCEPHIN) 2 g in sodium chloride 0.9 % 100 mL IVPB        2 g 200 mL/hr over 30 Minutes Intravenous  Once 08/02/20 1420 08/02/20 1524   08/02/20 1430  azithromycin (ZITHROMAX) 500 mg in sodium chloride 0.9 % 250 mL IVPB  Status:  Discontinued        500 mg 250 mL/hr over 60 Minutes Intravenous Every 24 hours 08/02/20 1420 08/02/20 1510      Subjective:  Patient seen and examined the bedside this morning predominantly stable.  Sitting on the chair.  Still on 2 to 3 L of oxygen per minute.  Denies any shortness of breath or cough at rest.  Objective: Vitals:   08/03/20 0644 08/03/20 1500 08/03/20 2144 08/04/20 0544  BP: 116/77 123/80 128/77 131/77  Pulse: 75 71 78 83  Resp: 18 18 16 18   Temp: 97.9 F (36.6 C) 98.2 F (36.8 C) 98.3 F (36.8 C) 98 F (36.7 C)  TempSrc: Oral Oral Oral Oral  SpO2: 93% 97% 95% 93%  Weight:      Height:        Intake/Output Summary (Last 24 hours) at 08/04/2020 0735 Last data filed at 08/04/2020 0600 Gross per 24 hour  Intake 1153.03 ml  Output --  Net 1153.03 ml   Filed Weights   08/02/20 0934 08/02/20 1635  Weight: 101.2 kg 102.2 kg    Examination:  General exam: Comfortable, obese HEENT:PERRL,Oral mucosa moist, Ear/Nose normal on gross exam Respiratory system: Bilateral mild decreased air entry bilaterally on the bases, no wheezes or crackles  cardiovascular system: S1 & S2 heard, RRR. No JVD, murmurs, rubs, gallops or clicks. Gastrointestinal system: Abdomen is nondistended, soft and nontender. No organomegaly or masses felt. Normal bowel sounds heard. Central nervous system: Alert and oriented. No focal neurological deficits. Extremities: No edema, no clubbing ,no cyanosis, distal peripheral pulses palpable. Skin: No rashes, lesions or ulcers,no icterus ,no pallor    Data Reviewed: I have  personally reviewed following labs and imaging studies  CBC: Recent Labs  Lab 08/02/20 1054 08/03/20 0615  WBC 7.3 6.3  NEUTROABS  --  4.2  HGB 13.0 13.0  HCT 43.7 42.9  MCV 109.5* 110.6*  PLT 173 638   Basic Metabolic Panel: Recent Labs  Lab 08/02/20 1054 08/03/20 0615  NA 140 140  K 3.8 4.1  CL 103 105  CO2 29 29  GLUCOSE 101* 109*  BUN 12 10  CREATININE 0.74 0.62  CALCIUM 8.5* 8.1*   GFR: Estimated Creatinine Clearance: 85.4 mL/min (by C-G formula based on SCr of  0.62 mg/dL). Liver Function Tests: Recent Labs  Lab 08/03/20 0615  AST 11*  ALT 16  ALKPHOS 83  BILITOT 0.9  PROT 6.2*  ALBUMIN 3.1*   No results for input(s): LIPASE, AMYLASE in the last 168 hours. No results for input(s): AMMONIA in the last 168 hours. Coagulation Profile: No results for input(s): INR, PROTIME in the last 168 hours. Cardiac Enzymes: No results for input(s): CKTOTAL, CKMB, CKMBINDEX, TROPONINI in the last 168 hours. BNP (last 3 results) No results for input(s): PROBNP in the last 8760 hours. HbA1C: No results for input(s): HGBA1C in the last 72 hours. CBG: No results for input(s): GLUCAP in the last 168 hours. Lipid Profile: Recent Labs    08/02/20 1330  TRIG 77   Thyroid Function Tests: No results for input(s): TSH, T4TOTAL, FREET4, T3FREE, THYROIDAB in the last 72 hours. Anemia Panel: Recent Labs    08/02/20 1330  FERRITIN 25   Sepsis Labs: Recent Labs  Lab 08/02/20 1330  PROCALCITON <0.10  LATICACIDVEN 0.7    Recent Results (from the past 240 hour(s))  SARS Coronavirus 2 by RT PCR (hospital order, performed in [facility] hospital lab) Nasopharyngeal Nasopharyngeal Swab     Status: None   Collection Time: 08/02/20 12:43 PM   Specimen: Nasopharyngeal Swab  Result Value Ref Range Status   SARS Coronavirus 2 NEGATIVE NEGATIVE Final    Comment: (NOTE) SARS-CoV-2 target nucleic acids are NOT DETECTED.  The SARS-CoV-2 RNA is generally detectable in upper  and lower respiratory specimens during the acute phase of infection. The lowest concentration of SARS-CoV-2 viral copies this assay can detect is 250 copies / mL. A negative result does not preclude SARS-CoV-2 infection and should not be used as the sole basis for treatment or other patient management decisions.  A negative result may occur with improper specimen collection / handling, submission of specimen other than nasopharyngeal swab, presence of viral mutation(s) within the areas targeted by this assay, and inadequate number of viral copies (<250 copies / mL). A negative result must be combined with clinical observations, patient history, and epidemiological information.  Fact Sheet for Patients:   StrictlyIdeas.no  Fact Sheet for Healthcare Providers: BankingDealers.co.za  This test is not yet approved or  cleared by the [place] FDA and has been authorized for detection and/or diagnosis of SARS-CoV-2 by FDA under an Emergency Use Authorization (EUA).  This EUA will remain in effect (meaning this test can be used) for the duration of the COVID-19 declaration under Section 564(b)(1) of the Act, 21 U.S.C. section 360bbb-3(b)(1), unless the authorization is terminated or revoked sooner.  Performed at [facility], [address], [place], [place] [address]   Culture, blood (single) w Reflex to ID Panel     Status: None (Preliminary result)   Collection Time: 08/02/20  1:31 PM   Specimen: Left Antecubital; Blood  Result Value Ref Range Status   Specimen Description LEFT ANTECUBITAL  Final   Special Requests   Final    BOTTLES DRAWN AEROBIC AND ANAEROBIC Blood Culture adequate volume   Culture   Final    NO GROWTH 2 DAYS Performed at [facility], [address] [place], [place] [address]    Report Status PENDING  Incomplete  Culture, blood (routine x 2)     Status: None (Preliminary result)   Collection Time: 08/02/20  6:38  PM   Specimen: Left Antecubital; Blood  Result Value Ref Range Status   Specimen Description LEFT ANTECUBITAL  Final  Special Requests   Final    BOTTLES DRAWN AEROBIC AND ANAEROBIC Blood Culture adequate volume   Culture   Final    NO GROWTH 2 DAYS Performed at [facility], [address] [place], [place] [address]    Report Status PENDING  Incomplete  Culture, blood (routine x 2)     Status: None (Preliminary result)   Collection Time: 08/02/20  6:38 PM   Specimen: BLOOD LEFT HAND  Result Value Ref Range Status   Specimen Description BLOOD LEFT HAND  Final   Special Requests   Final    BOTTLES DRAWN AEROBIC AND ANAEROBIC Blood Culture adequate volume   Culture   Final    NO GROWTH 2 DAYS Performed at [facility], [address] [place], [place] [address]    Report Status PENDING  Incomplete         Radiology Studies: CT HEAD WO CONTRAST  Result Date: 08/02/2020 CLINICAL DATA:  Vertigo, central. Additional history provided: Patient reports intermittent dizziness and weak grip bilaterally for the past 3 days. EXAM: CT HEAD WITHOUT CONTRAST TECHNIQUE: Contiguous axial images were obtained from the base of the skull through the vertex without intravenous contrast. COMPARISON:  No pertinent prior exams are available for comparison. FINDINGS: Brain: Cerebral volume is normal. There is no acute intracranial hemorrhage. No demarcated cortical infarct. No extra-axial fluid collection. No evidence of intracranial mass. No midline shift. Vascular: No hyperdense vessel. Skull: Normal. Negative for fracture or focal suspicious lesion. Sinuses/Orbits: Visualized orbits show no acute finding. No significant paranasal sinus disease or mastoid effusion at the imaged levels. IMPRESSION: Unremarkable non-contrast CT appearance of the brain for age. No evidence of acute intracranial abnormality. Electronically Signed   By: Kellie Simmering DO   On: 08/02/2020 12:22   MR BRAIN WO CONTRAST  Result  Date: 08/02/2020 CLINICAL DATA:  Neuro deficit, acute, stroke suspected. Additional history provided: Patient reports intermittent dizziness and weak grip bilaterally for the past 3 days. EXAM: MRI HEAD WITHOUT CONTRAST TECHNIQUE: Multiplanar, multiecho pulse sequences of the brain and surrounding structures were obtained without intravenous contrast. COMPARISON:  Noncontrast head CT performed earlier the same day 08/02/2020. FINDINGS: Brain: Cerebral volume is normal for age. Mild multifocal T2/FLAIR hyperintensity within the cerebral white matter and pons is nonspecific, but consistent with chronic small vessel ischemic disease. There is no acute infarct. No evidence of intracranial mass. No chronic intracranial blood products. No extra-axial fluid collection. No midline shift. Vascular: Expected proximal arterial flow voids. Skull and upper cervical spine: No focal marrow lesion. Sinuses/Orbits: Visualized orbits show no acute finding. Trace ethmoid sinus mucosal thickening. IMPRESSION: No evidence of acute intracranial abnormality, including acute infarction. Mild chronic small vessel ischemic changes within the cerebral white matter and pons. Electronically Signed   By: Kellie Simmering DO   On: 08/02/2020 13:39   DG Chest Port 1 View  Result Date: 08/02/2020 CLINICAL DATA:  Hypoxia EXAM: PORTABLE CHEST 1 VIEW COMPARISON:  None. FINDINGS: Prominence of the cardiomediastinal silhouette may be secondary to mild hypoinflation and AP technique. Patchy left basilar opacities. No pneumothorax or pleural effusion. No acute osseous abnormality. IMPRESSION: Left basilar opacities are concerning for infection. Electronically Signed   By: Primitivo Gauze M.D.   On: 08/02/2020 11:13   DG ESOPHAGUS W DOUBLE CM (HD)  Result Date: 08/03/2020 CLINICAL DATA:  59 year old female with history of dysphagia. EXAM: ESOPHOGRAM / BARIUM SWALLOW / BARIUM TABLET STUDY TECHNIQUE: Combined double contrast and single contrast  examination performed  using effervescent crystals, thick barium liquid, and thin barium liquid. The patient was observed with fluoroscopy swallowing a 13 mm barium sulphate tablet. FLUOROSCOPY TIME:  Fluoroscopy Time: 2 minutes and 42 seconds Radiation Exposure Index (if provided by the fluoroscopic device): 24.4 mGy Number of Acquired Spot Images: 0 COMPARISON:  None. FINDINGS: Double contrast images of the esophagus demonstrate a normal appearance of the esophageal mucosa. Multiple single swallow attempts were observed which demonstrated failure to propagate a primary peristaltic wave, with extensive tertiary contractions. Full column esophagram demonstrated normal esophageal anatomy. Specifically, no hiatal hernia, and no evidence of esophageal mass, stricture or esophageal ring. A barium tablet was administered, however, the patient was unable to successfully swallow the tablet. IMPRESSION: 1. 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 Electronically Signed   By: Vinnie Langton M.D.   On: 08/03/2020 16:53        Scheduled Meds: . ALPRAZolam  1 mg Oral BID  . atorvastatin  10 mg Oral QHS  . gabapentin  600 mg Oral TID  . nicotine  21 mg Transdermal Daily  . [START ON 08/08/2020] Vitamin D (Ergocalciferol)  50,000 Units Oral Q7 days   Continuous Infusions: . azithromycin 500 mg (08/03/20 1418)  . cefTRIAXone (ROCEPHIN)  IV 2 g (08/03/20 1436)     LOS: 2 days    Time spent: 25 mins,More than 50% of that time was spent in counseling and/or coordination of care.      Shelly Coss, MD [facility] P9/24/2021, 7:35 AM

## 2020-08-04 NOTE — Progress Notes (Signed)
°  Speech Language Pathology Treatment: Dysphagia  Patient Details Name: Lydia Alexander MRN: [id number] DOB: September 12, 1961 Today's Date: 08/04/2020 Time: 2244-9753 SLP Time Calculation (min) (ACUTE ONLY): 22 min  Assessment / Plan / Recommendation Clinical Impression  Provided ongoing diagnostic dysphagia therapy, Pt consumed trials of thin liquids without overt s/sx of aspiration. Pt underwent barium pill esophagram yesterday revealing "Nonspecific esophageal motility disorder with extensive tertiary Contractions". SLP provided education regarding esophageal precautions and further reviewed universal aspiration precautions. Per GI note today, Pt will triage for EGD ASAP. There are no further oropharyngeal concerns at this time, therefore SLP will sign off. Thank you for this consult.   HPI HPI: The patient is a 59 yr old woman who sees an PA in the [facility] for [facility]. She states that she has no medical problems other than chronic back pain and depression. The patient states that since Saturday she has felt dizzy, has been dropping things, and didn't feel safe driving. She was initially worked up for a potential CVA in the ED, but CT head and MRI brain were negative. However the patient was found to be hypoxic at 76% on room air with ambulation. CXR demonstrated left basilar opacities.       SLP Plan  Continue with current plan of care       Recommendations  Diet recommendations: Regular;Thin liquid Liquids provided via: Cup;Straw Medication Administration: Whole meds with liquid Supervision: Patient able to self feed Compensations: Follow solids with liquid Postural Changes and/or Swallow Maneuvers: Seated upright 90 degrees;Upright 30-60 min after meal                Oral Care Recommendations: Oral care BID Follow up Recommendations: None SLP Visit Diagnosis: Dysphagia, unspecified (R13.10) Plan: Continue with current plan of care       Lydia Laursen H.  Roddie Mc, CCC-SLP Speech Language Pathologist    Wende Bushy 08/04/2020, 10:35 AM

## 2020-08-04 NOTE — Care Management Important Message (Signed)
Important Message  Patient Details  Name: Lydia Alexander MRN: [id number] Date of Birth: 1961/04/23   Medicare Important Message Given:  Yes     Tommy Medal 08/04/2020, 3:21 PM

## 2020-08-04 NOTE — Telephone Encounter (Signed)
Patient admitted for pneumonia, has dysphagia complicating things. Deferred to EGD with dilation as outpatient due to desaturation from pneumonia.  Spoke with Dr. Abbey Chatters, ok to triage patient for EGD +/- dilation on propofol. ASA III  Please schedule patient for ASAP procedure.  Let me know if any questions

## 2020-08-04 NOTE — ACP (Advance Care Planning) (Signed)
Ms Nader was sleeping and I placed the AD information on her bedside table, along with contact information.

## 2020-08-04 NOTE — Plan of Care (Signed)
  Problem: Acute Rehab PT Goals(only PT should resolve) Goal: Patient Will Transfer Sit To/From Stand Outcome: Progressing Flowsheets (Taken 08/04/2020 0915) Patient will transfer sit to/from stand: Independently Goal: Pt Will Transfer Bed To Chair/Chair To Bed Outcome: Progressing Flowsheets (Taken 08/04/2020 0915) Pt will Transfer Bed to Chair/Chair to Bed: with modified independence Goal: Pt Will Ambulate Outcome: Progressing Flowsheets (Taken 08/04/2020 0915) Pt will Ambulate:  > 125 feet  with modified independence Goal: Pt/caregiver will Perform Home Exercise Program Outcome: Progressing Flowsheets (Taken 08/04/2020 0915) Pt/caregiver will Perform Home Exercise Program:  For increased strengthening  For improved balance  Independently  9:16 AM, 08/04/20 Mearl Latin PT, DPT Physical Therapist at [facility]

## 2020-08-04 NOTE — Evaluation (Signed)
Physical Therapy Evaluation Patient Details Name: Lydia Alexander MRN: [id number] DOB: [birth year] Today's Date: 08/04/2020   History of Present Illness  The patient is a 59 yr old woman who sees an PA in the [facility] for her regular care. She states that she has no medical problems other than chronic back pain and depression. The patient states that since Saturday she has felt dizzy, has been dropping things, and didn't feel safe driving. She was initially worked up for a potential CVA in the ED, but CT head and MRI brain were negative. However the patient was found to be hypoxic at 76% on room air with ambulation. CXR demonstrated left basilar opacities.    Clinical Impression  Patient functioning near/at baseline for functional mobility and gait but patient limited for functional mobility as stated below secondary to minimal BLE weakness, fatigue and impaired activity tolerance. Patient O2 sat at 92% while on 1.5L at rest. O2 sat decreased to 82-88% while standing/ ambulating several steps on room air. Patient placed back on 2L O2 and O2 sat continued to decrease to 76-80% while on 2 L. Upon returning to sitting, it took several minutes for O2 sat to increase to upper 80s. Patient without SOB or symptoms throughout session. Patient tolerates ambulating without loss of balance or use of AD today. Patient ends session seated in chair. Patient will benefit from continued physical therapy in hospital and recommended venue below to increase strength, balance, endurance for safe ADLs and gait.     Follow Up Recommendations No PT follow up    Equipment Recommendations  None recommended by PT    Recommendations for Other Services       Precautions / Restrictions Precautions Precautions: Fall Restrictions Weight Bearing Restrictions: No      Mobility  Bed Mobility Overal bed mobility: Modified Independent                Transfers Overall transfer level: Modified  independent               General transfer comment: no AD to transfer to standing, O2 seated at rest on 1.5 L 92%, once standing on room air decreases to 82-88%  Ambulation/Gait Ambulation/Gait assistance: Supervision Gait Distance (Feet): 120 Feet Assistive device: None Gait Pattern/deviations: Step-through pattern Gait velocity: decreased   General Gait Details: decreased cadence, no loss of balance, O2 decrease to 76-80% while ambulating on 2 L O2 which takes several minutes to increase to upper 80s upon returning to sitting, no symptoms or SOB while ambulating  Stairs            Wheelchair Mobility    Modified Rankin (Stroke Patients Only)       Balance Overall balance assessment: Needs assistance   Sitting balance-Leahy Scale: Normal Sitting balance - Comments: seated EOB   Standing balance support: No upper extremity supported Standing balance-Leahy Scale: Good Standing balance comment: Good/fair without AD                             Pertinent Vitals/Pain Pain Assessment: 0-10 Pain Score: 4  Pain Location: back Pain Intervention(s): Limited activity within patient's tolerance;Monitored during session;Repositioned    Home Living Family/patient expects to be discharged to:: Private residence Living Arrangements: Alone Available Help at Discharge: Family;Available PRN/intermittently Type of Home: House Home Access: Stairs to enter Entrance Stairs-Rails: None Entrance Stairs-Number of Steps: 1 Home Layout: One level Home Equipment: None Additional Comments: Does not  use basement, son lives across the street and can assist if needed    Prior Function Level of Independence: Independent         Comments: limited community ambulator, works, Dispensing optician        Extremity/Trunk Assessment   Upper Extremity Assessment Upper Extremity Assessment: Overall WFL for tasks assessed    Lower Extremity Assessment Lower  Extremity Assessment: Overall WFL for tasks assessed    Cervical / Trunk Assessment Cervical / Trunk Assessment: Normal  Communication   Communication: No difficulties  Cognition Arousal/Alertness: Awake/alert Behavior During Therapy: WFL for tasks assessed/performed Overall Cognitive Status: Within Functional Limits for tasks assessed                                        General Comments      Exercises     Assessment/Plan    PT Assessment Patient needs continued PT services  PT Problem List Decreased strength;Decreased activity tolerance;Cardiopulmonary status limiting activity;Decreased mobility       PT Treatment Interventions DME instruction;Therapeutic exercise;Gait training;Balance training;Stair training;Neuromuscular re-education;Functional mobility training;Therapeutic activities;Patient/family education    PT Goals (Current goals can be found in the Care Plan section)  Acute Rehab PT Goals Patient Stated Goal: Return home PT Goal Formulation: With patient Time For Goal Achievement: 08/11/20 Potential to Achieve Goals: Good    Frequency Min 3X/week   Barriers to discharge        Co-evaluation               AM-PAC PT "6 Clicks" Mobility  Outcome Measure Help needed turning from your back to your side while in a flat bed without using bedrails?: None Help needed moving from lying on your back to sitting on the side of a flat bed without using bedrails?: None Help needed moving to and from a bed to a chair (including a wheelchair)?: None Help needed standing up from a chair using your arms (e.g., wheelchair or bedside chair)?: None Help needed to walk in hospital room?: A Little Help needed climbing 3-5 steps with a railing? : A Little 6 Click Score: 22    End of Session Equipment Utilized During Treatment: Gait belt;Oxygen Activity Tolerance: Patient tolerated treatment well Patient left: with call bell/phone within reach;in  chair Nurse Communication: Mobility status PT Visit Diagnosis: Unsteadiness on feet (R26.81);Other abnormalities of gait and mobility (R26.89);Muscle weakness (generalized) (M62.81)    Time: 0962-8366 PT Time Calculation (min) (ACUTE ONLY): 27 min   Charges:   PT Evaluation $PT Eval Moderate Complexity: 1 Mod PT Treatments $Therapeutic Activity: 8-22 mins        9:11 AM, 08/04/20 Mearl Latin PT, DPT Physical Therapist at [facility]

## 2020-08-05 MED ORDER — NICOTINE 21 MG/24HR TD PT24: 21.0000 mg | MEDICATED_PATCH | Freq: Every day | TRANSDERMAL | 1 refills | Status: DC

## 2020-08-05 MED ORDER — ALBUTEROL SULFATE HFA 108 (90 BASE) MCG/ACT IN AERS: 2.0000 | INHALATION_SPRAY | Freq: Four times a day (QID) | RESPIRATORY_TRACT | 2 refills | Status: AC | PRN

## 2020-08-05 MED ORDER — NAPROXEN 500 MG PO TABS: 500.0000 mg | ORAL_TABLET | Freq: Every day | ORAL | Status: DC | PRN

## 2020-08-05 MED ORDER — PANTOPRAZOLE SODIUM 40 MG PO TBEC
40.0000 mg | DELAYED_RELEASE_TABLET | Freq: Every day | ORAL | 1 refills | Status: DC
Start: 2020-08-06 — End: 2020-11-23

## 2020-08-05 MED ORDER — AZITHROMYCIN 500 MG PO TABS: 500.0000 mg | ORAL_TABLET | Freq: Every day | ORAL | 0 refills | Status: AC

## 2020-08-05 MED ORDER — CEFDINIR 300 MG PO CAPS: 300.0000 mg | ORAL_CAPSULE | Freq: Two times a day (BID) | ORAL | 0 refills | Status: AC

## 2020-08-05 NOTE — TOC Transition Note (Addendum)
Transition of Care [facility]) - CM/SW Discharge Note   Patient Details  Name: Lydia Alexander MRN: [id number] Date of Birth: 1961/07/18  [facility] CM/SW Contact:  Natasha Bence, LCSW Phone Number: 08/05/2020, 1:12 PM   Clinical Narrative:    CSW received consult for O2. CSW placed referral for O2 with [employer]. Patient does not have a Chronic respiratory illness and would require O2 through [employer]. [employer] agreeable to provide O2 with waiver. [employer] reported that patient's O2 will be provided by [facility] office due to patient's location. TOC signing off.   Addendum 4:30pm CSW contacted [employer] to verify O2 delivery. Caryl Pina with [employer] contacted the driver who reported that they had an O2 emergency with another patient and had to reroute. Caryl Pina reported that his driver stated that he would be dropping off O2 in the next 30 minutes. TOC signing off  Final next level of care: Home/Self Care Barriers to Discharge: Barriers Resolved   Patient Goals and CMS Choice Patient states their goals for this hospitalization and ongoing recovery are:: Return home with O2. CMS Medicare.gov Compare Post Acute Care list provided to:: Patient Choice offered to / list presented to : Patient  Discharge Placement                    Patient and family notified of of transfer: 08/05/20  Discharge Plan and Services                DME Arranged: Oxygen DME Agency: [employer] Date DME Agency Contacted: 08/05/20 Time DME Agency Contacted: 1200 Representative spoke with at DME Agency: [place] Interventions     Readmission Risk Interventions No flowsheet data found.

## 2020-08-05 NOTE — Discharge Summary (Signed)
Physician Discharge Summary  Lydia Alexander BRA:[id number] DOB: 1961/09/16 DOA: 08/02/2020  PCP: Bridget Hartshorn, NP  Admit date: 08/02/2020 Discharge date: 08/05/2020  Admitted From: Home Disposition:  Home  Discharge Condition:Stable CODE STATUS:FULL Diet recommendation: Heart Healthy   Brief/Interim Summary:  Patient is a 59 year old female with history of chronic back pain, depression who presents to the emergency department complaints of dizziness while at work.  Stroke was suspected on presentation.  MRI of the brain was negative for any acute intracranial abnormalities.  Patient was also found to be hypoxic at 76% on room air with ambulation.  Chest x-ray showed left basilar opacity.  Patient was admitted for the management of community-acquired pneumonia versus aspiration pneumonia and was started on antibiotics.  She had history of dysphagia and barium esophagram showed esophageal dysmotility.  GI consulted and recommend outpatient follow-up. Hospital course remarkable for persistent requirement of oxygen.  She qualified for home oxygen on discharge.  She is medically stable for discharge home today on oral antibiotics.  Following problems were addressed during her hospitalization:  Acute hypoxic respiratory failure:  No documented history of asthma or COPD.  Hypoxic at 76% on room air on presentation.  Currently needing 2 L of oxygen per minute.  She qualified for home oxygen.  She might have undiagnosed COPD.  Denies any shortness of breath or cough at rest.  Community-acquired pneumonia: Chest x-ray showed left basilar opacity .started on azithromycin and ceftriaxone for community-acquired pneumonia.   Dysphagia: Speech therapy following, she has history of choking spells.  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  GI consulted, plan for EGD as an outpatient..  Dizziness:  Resolved. MRI of the brain did  not show any acute intracranial abnormalities.  PT did not recommend any follow-up.  Anxiety disorder: On Xanax at home.  Hyperlipidemia: Takes Lipitor 10 mg at home.  Chronic back pain: On gabapentin.  Continue supportive care.    Nicotine dependence: Smokes a pack a day.  Counseled for cessation..  Continue nicotine patch.  Headache: Continue Tylenol.  Morbid obesity: BMI of 39.9  Discharge Diagnoses:  Principal Problem:   Acute respiratory failure with hypoxia ([place]) Active Problems:   Community acquired pneumonia   Peripheral edema   Chronic back pain   Dysphagia    Discharge Instructions  Discharge Instructions    Diet - low sodium heart healthy   Complete by: As directed    Diet general   Complete by: As directed    Discharge instructions   Complete by: As directed    1)Please follow-up with your PCP in a week. 2)Take prescribed medications as instructed. 3) stop smoking 4)You will be called by gastroenterology for the follow up appointment   Increase activity slowly   Complete by: As directed      Allergies as of 08/05/2020   No Known Allergies     Medication List    TAKE these medications   albuterol 108 (90 Base) MCG/ACT inhaler Commonly known as: VENTOLIN HFA Inhale 2 puffs into the lungs every 6 (six) hours as needed for wheezing or shortness of breath.   ALPRAZolam 1 MG tablet Commonly known as: XANAX Take 1 mg by mouth 2 (two) times daily.   atorvastatin 10 MG tablet Commonly known as: LIPITOR Take 10 mg by mouth at bedtime.   azithromycin 500 MG tablet Commonly known as: Zithromax Take 1 tablet (500 mg total) by mouth daily for 2 days. Take 1 tablet  daily for 3 days.   cefdinir 300 MG capsule Commonly known as: OMNICEF Take 1 capsule (300 mg total) by mouth 2 (two) times daily for 2 days.   cyclobenzaprine 10 MG tablet Commonly known as: FLEXERIL Take 10 mg by mouth 3 (three) times daily as needed (Muscle spasms).   gabapentin  300 MG capsule Commonly known as: NEURONTIN Take 600 mg by mouth in the morning, at noon, and at bedtime.   naproxen 500 MG tablet Commonly known as: NAPROSYN Take 1 tablet (500 mg total) by mouth daily as needed. What changed:   when to take this  reasons to take this   nicotine 21 mg/24hr patch Commonly known as: NICODERM CQ - dosed in mg/24 hours Place 1 patch (21 mg total) onto the skin daily. Start taking on: August 06, 2020   pantoprazole 40 MG tablet Commonly known as: PROTONIX Take 1 tablet (40 mg total) by mouth daily. Start taking on: August 06, 2020   Vitamin D (Ergocalciferol) 1.25 MG (50000 UNIT) Caps capsule Commonly known as: DRISDOL Take 50,000 Units by mouth once a week.            Durable Medical Equipment  (From admission, onward)         Start     Ordered   08/05/20 1047  For home use only DME oxygen  Once       Question Answer Comment  Length of Need Lifetime   Mode or (Route) Nasal cannula   Liters per Minute 2   Frequency Continuous (stationary and portable oxygen unit needed)   Oxygen delivery system Gas      08/05/20 1047          Follow-up Information    H[place], Karie Schwalbe, NP. Schedule an appointment as soon as possible for a visit in 1 week(s).   Specialty: Adult Health Nurse Practitioner Contact information: [place] [phone]              No Known Allergies  Consultations: GI  Procedures/Studies: CT HEAD WO CONTRAST  Result Date: 08/02/2020 CLINICAL DATA:  Vertigo, central. Additional history provided: Patient reports intermittent dizziness and weak grip bilaterally for the past 3 days. EXAM: CT HEAD WITHOUT CONTRAST TECHNIQUE: Contiguous axial images were obtained from the base of the skull through the vertex without intravenous contrast. COMPARISON:  No pertinent prior exams are available for comparison. FINDINGS: Brain: Cerebral volume is normal. There is  no acute intracranial hemorrhage. No demarcated cortical infarct. No extra-axial fluid collection. No evidence of intracranial mass. No midline shift. Vascular: No hyperdense vessel. Skull: Normal. Negative for fracture or focal suspicious lesion. Sinuses/Orbits: Visualized orbits show no acute finding. No significant paranasal sinus disease or mastoid effusion at the imaged levels. IMPRESSION: Unremarkable non-contrast CT appearance of the brain for age. No evidence of acute intracranial abnormality. Electronically Signed   By: Kellie Simmering DO   On: 08/02/2020 12:22   MR BRAIN WO CONTRAST  Result Date: 08/02/2020 CLINICAL DATA:  Neuro deficit, acute, stroke suspected. Additional history provided: Patient reports intermittent dizziness and weak grip bilaterally for the past 3 days. EXAM: MRI HEAD WITHOUT CONTRAST TECHNIQUE: Multiplanar, multiecho pulse sequences of the brain and surrounding structures were obtained without intravenous contrast. COMPARISON:  Noncontrast head CT performed earlier the same day 08/02/2020. FINDINGS: Brain: Cerebral volume is normal for age. Mild multifocal T2/FLAIR hyperintensity within the cerebral white matter and pons is nonspecific, but consistent with chronic  small vessel ischemic disease. There is no acute infarct. No evidence of intracranial mass. No chronic intracranial blood products. No extra-axial fluid collection. No midline shift. Vascular: Expected proximal arterial flow voids. Skull and upper cervical spine: No focal marrow lesion. Sinuses/Orbits: Visualized orbits show no acute finding. Trace ethmoid sinus mucosal thickening. IMPRESSION: No evidence of acute intracranial abnormality, including acute infarction. Mild chronic small vessel ischemic changes within the cerebral white matter and pons. Electronically Signed   By: Kellie Simmering DO   On: 08/02/2020 13:39   DG Chest Port 1 View  Result Date: 08/02/2020 CLINICAL DATA:  Hypoxia EXAM: PORTABLE CHEST 1 VIEW  COMPARISON:  None. FINDINGS: Prominence of the cardiomediastinal silhouette may be secondary to mild hypoinflation and AP technique. Patchy left basilar opacities. No pneumothorax or pleural effusion. No acute osseous abnormality. IMPRESSION: Left basilar opacities are concerning for infection. Electronically Signed   By: Primitivo Gauze M.D.   On: 08/02/2020 11:13   DG ESOPHAGUS W DOUBLE CM (HD)  Result Date: 08/03/2020 CLINICAL DATA:  59 year old female with history of dysphagia. EXAM: ESOPHOGRAM / BARIUM SWALLOW / BARIUM TABLET STUDY TECHNIQUE: Combined double contrast and single contrast examination performed using effervescent crystals, thick barium liquid, and thin barium liquid. The patient was observed with fluoroscopy swallowing a 13 mm barium sulphate tablet. FLUOROSCOPY TIME:  Fluoroscopy Time: 2 minutes and 42 seconds Radiation Exposure Index (if provided by the fluoroscopic device): 24.4 mGy Number of Acquired Spot Images: 0 COMPARISON:  None. FINDINGS: Double contrast images of the esophagus demonstrate a normal appearance of the esophageal mucosa. Multiple single swallow attempts were observed which demonstrated failure to propagate a primary peristaltic wave, with extensive tertiary contractions. Full column esophagram demonstrated normal esophageal anatomy. Specifically, no hiatal hernia, and no evidence of esophageal mass, stricture or esophageal ring. A barium tablet was administered, however, the patient was unable to successfully swallow the tablet. IMPRESSION: 1. 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 Electronically Signed   By: Vinnie Langton M.D.   On: 08/03/2020 16:53       Subjective: Patient seen and examined at the bedside this morning.  Hemodynamically stable for discharge today.  Discharge Exam: Vitals:   08/04/20 2025 08/05/20 0537  BP: 121/71 120/79  Pulse: 80 79  Resp: 18 18  Temp: 98.6 F (37 C) 97.9 F (36.6 C)   SpO2: 93% 95%   Vitals:   08/04/20 0544 08/04/20 1442 08/04/20 2025 08/05/20 0537  BP: 131/77 129/76 121/71 120/79  Pulse: 83 77 80 79  Resp: 18 18 18 18   Temp: 98 F (36.7 C) 98.1 F (36.7 C) 98.6 F (37 C) 97.9 F (36.6 C)  TempSrc: Oral   Oral  SpO2: 93% 95% 93% 95%  Weight:      Height:        General: Pt is alert, awake, not in acute distress Cardiovascular: RRR, S1/S2 +, no rubs, no gallops Respiratory: CTA bilaterally, no wheezing, no rhonchi Abdominal: Soft, NT, ND, bowel sounds + Extremities: no edema, no cyanosis    The results of significant diagnostics from this hospitalization (including imaging, microbiology, ancillary and laboratory) are listed below for reference.     Microbiology: Recent Results (from the past 240 hour(s))  SARS Coronavirus 2 by RT PCR (hospital order, performed in [facility] hospital lab) Nasopharyngeal Nasopharyngeal Swab     Status: None   Collection Time: 08/02/20 12:43 PM   Specimen: Nasopharyngeal Swab  Result Value Ref Range Status   SARS  Coronavirus 2 NEGATIVE NEGATIVE Final    Comment: (NOTE) SARS-CoV-2 target nucleic acids are NOT DETECTED.  The SARS-CoV-2 RNA is generally detectable in upper and lower respiratory specimens during the acute phase of infection. The lowest concentration of SARS-CoV-2 viral copies this assay can detect is 250 copies / mL. A negative result does not preclude SARS-CoV-2 infection and should not be used as the sole basis for treatment or other patient management decisions.  A negative result may occur with improper specimen collection / handling, submission of specimen other than nasopharyngeal swab, presence of viral mutation(s) within the areas targeted by this assay, and inadequate number of viral copies (<250 copies / mL). A negative result must be combined with clinical observations, patient history, and epidemiological information.  Fact Sheet for Patients:    StrictlyIdeas.no  Fact Sheet for Healthcare Providers: BankingDealers.co.za  This test is not yet approved or  cleared by the [place] FDA and has been authorized for detection and/or diagnosis of SARS-CoV-2 by FDA under an Emergency Use Authorization (EUA).  This EUA will remain in effect (meaning this test can be used) for the duration of the COVID-19 declaration under Section 564(b)(1) of the Act, 21 U.S.C. section 360bbb-3(b)(1), unless the authorization is terminated or revoked sooner.  Performed at [facility], [address], [place], [place] [address]   Culture, blood (single) w Reflex to ID Panel     Status: None (Preliminary result)   Collection Time: 08/02/20  1:31 PM   Specimen: Left Antecubital; Blood  Result Value Ref Range Status   Specimen Description LEFT ANTECUBITAL  Final   Special Requests   Final    BOTTLES DRAWN AEROBIC AND ANAEROBIC Blood Culture adequate volume   Culture   Final    NO GROWTH 3 DAYS Performed at [facility], [address], [place], [place] [address]    Report Status PENDING  Incomplete  Culture, blood (routine x 2)     Status: None (Preliminary result)   Collection Time: 08/02/20  6:38 PM   Specimen: Left Antecubital; Blood  Result Value Ref Range Status   Specimen Description LEFT ANTECUBITAL  Final   Special Requests   Final    BOTTLES DRAWN AEROBIC AND ANAEROBIC Blood Culture adequate volume   Culture   Final    NO GROWTH 3 DAYS Performed at [facility], [address] [place], [place] [address]    Report Status PENDING  Incomplete  Culture, blood (routine x 2)     Status: None (Preliminary result)   Collection Time: 08/02/20  6:38 PM   Specimen: BLOOD LEFT HAND  Result Value Ref Range Status   Specimen Description BLOOD LEFT HAND  Final   Special Requests   Final    BOTTLES DRAWN AEROBIC AND ANAEROBIC Blood Culture adequate volume   Culture   Final    NO GROWTH 3  DAYS Performed at [facility], [address], [place], [place] [address]    Report Status PENDING  Incomplete     Labs: BNP (last 3 results) Recent Labs    03/18/20 0027  BNP 23.5   Basic Metabolic Panel: Recent Labs  Lab 08/02/20 1054 08/03/20 0615  NA 140 140  K 3.8 4.1  CL 103 105  CO2 29 29  GLUCOSE 101* 109*  BUN 12 10  CREATININE 0.74 0.62  CALCIUM 8.5* 8.1*   Liver Function Tests: Recent Labs  Lab 08/03/20 0615  AST 11*  ALT 16  ALKPHOS 83  BILITOT  0.9  PROT 6.2*  ALBUMIN 3.1*   No results for input(s): LIPASE, AMYLASE in the last 168 hours. No results for input(s): AMMONIA in the last 168 hours. CBC: Recent Labs  Lab 08/02/20 1054 08/03/20 0615  WBC 7.3 6.3  NEUTROABS  --  4.2  HGB 13.0 13.0  HCT 43.7 42.9  MCV 109.5* 110.6*  PLT 173 155   Cardiac Enzymes: No results for input(s): CKTOTAL, CKMB, CKMBINDEX, TROPONINI in the last 168 hours. BNP: Invalid input(s): POCBNP CBG: No results for input(s): GLUCAP in the last 168 hours. D-Dimer Recent Labs    08/02/20 1110  DDIMER 0.57*   Hgb A1c No results for input(s): HGBA1C in the last 72 hours. Lipid Profile Recent Labs    08/02/20 1330  TRIG 77   Thyroid function studies No results for input(s): TSH, T4TOTAL, T3FREE, THYROIDAB in the last 72 hours.  Invalid input(s): FREET3 Anemia work up Recent Labs    08/02/20 1330  FERRITIN 25   Urinalysis    Component Value Date/Time   COLORURINE AMBER (A) 08/02/2020 1022   APPEARANCEUR CLOUDY (A) 08/02/2020 1022   LABSPEC 1.028 08/02/2020 1022   PHURINE 5.0 08/02/2020 1022   GLUCOSEU NEGATIVE 08/02/2020 1022   HGBUR NEGATIVE 08/02/2020 1022   BILIRUBINUR SMALL (A) 08/02/2020 1022   KETONESUR NEGATIVE 08/02/2020 1022   PROTEINUR 30 (A) 08/02/2020 1022   NITRITE NEGATIVE 08/02/2020 1022   LEUKOCYTESUR LARGE (A) 08/02/2020 1022   Sepsis Labs Invalid input(s): PROCALCITONIN,  WBC,  LACTICIDVEN Microbiology Recent Results (from  the past 240 hour(s))  SARS Coronavirus 2 by RT PCR (hospital order, performed in [place] hospital lab) Nasopharyngeal Nasopharyngeal Swab     Status: None   Collection Time: 08/02/20 12:43 PM   Specimen: Nasopharyngeal Swab  Result Value Ref Range Status   SARS Coronavirus 2 NEGATIVE NEGATIVE Final    Comment: (NOTE) SARS-CoV-2 target nucleic acids are NOT DETECTED.  The SARS-CoV-2 RNA is generally detectable in upper and lower respiratory specimens during the acute phase of infection. The lowest concentration of SARS-CoV-2 viral copies this assay can detect is 250 copies / mL. A negative result does not preclude SARS-CoV-2 infection and should not be used as the sole basis for treatment or other patient management decisions.  A negative result may occur with improper specimen collection / handling, submission of specimen other than nasopharyngeal swab, presence of viral mutation(s) within the areas targeted by this assay, and inadequate number of viral copies (<250 copies / mL). A negative result must be combined with clinical observations, patient history, and epidemiological information.  Fact Sheet for Patients:   StrictlyIdeas.no  Fact Sheet for Healthcare Providers: BankingDealers.co.za  This test is not yet approved or  cleared by the [place] FDA and has been authorized for detection and/or diagnosis of SARS-CoV-2 by FDA under an Emergency Use Authorization (EUA).  This EUA will remain in effect (meaning this test can be used) for the duration of the COVID-19 declaration under Section 564(b)(1) of the Act, 21 U.S.C. section 360bbb-3(b)(1), unless the authorization is terminated or revoked sooner.  Performed at [facility], [address], [place], [place] [address]   Culture, blood (single) w Reflex to ID Panel     Status: None (Preliminary result)   Collection Time: 08/02/20  1:31 PM   Specimen: Left Antecubital;  Blood  Result Value Ref Range Status   Specimen Description LEFT ANTECUBITAL  Final   Special Requests   Final    BOTTLES  DRAWN AEROBIC AND ANAEROBIC Blood Culture adequate volume   Culture   Final    NO GROWTH 3 DAYS Performed at [facility], [address] [place], [place] [address]    Report Status PENDING  Incomplete  Culture, blood (routine x 2)     Status: None (Preliminary result)   Collection Time: 08/02/20  6:38 PM   Specimen: Left Antecubital; Blood  Result Value Ref Range Status   Specimen Description LEFT ANTECUBITAL  Final   Special Requests   Final    BOTTLES DRAWN AEROBIC AND ANAEROBIC Blood Culture adequate volume   Culture   Final    NO GROWTH 3 DAYS Performed at [facility], [address], [place], [place] [address]    Report Status PENDING  Incomplete  Culture, blood (routine x 2)     Status: None (Preliminary result)   Collection Time: 08/02/20  6:38 PM   Specimen: BLOOD LEFT HAND  Result Value Ref Range Status   Specimen Description BLOOD LEFT HAND  Final   Special Requests   Final    BOTTLES DRAWN AEROBIC AND ANAEROBIC Blood Culture adequate volume   Culture   Final    NO GROWTH 3 DAYS Performed at [facility], [address] [place], [place] [address]    Report Status PENDING  Incomplete    Please note: You were cared for by a hospitalist during your hospital stay. Once you are discharged, your primary care physician will handle any further medical issues. Please note that NO REFILLS for any discharge medications will be authorized once you are discharged, as it is imperative that you return to your primary care physician (or establish a relationship with a primary care physician if you do not have one) for your post hospital discharge needs so that they can reassess your need for medications and monitor your lab values.    Time coordinating discharge: 40 minutes  SIGNED:   Shelly Coss, MD  [facility] 08/05/2020, 10:51 AM Pager  [phone]  If 7PM-7AM, please contact night-coverage [email or web address] Password [email or web address]

## 2020-08-05 NOTE — Progress Notes (Signed)
SATURATION QUALIFICATIONS: (This note is used to comply with regulatory documentation for home oxygen)  Patient Saturations on Room Air at Rest 85%  Patient Saturations on Room Air while Ambulating 81%  Patient Saturations on 2 Liters of oxygen while Ambulating 88  Please briefly explain why patient needs home oxygen:Patients admits she is experiencing shortness of breath with simple activity. She admits some resolution with 02 use

## 2020-08-07 LAB — CULTURE, BLOOD (ROUTINE X 2)
Culture: NO GROWTH
Culture: NO GROWTH
Special Requests: ADEQUATE
Special Requests: ADEQUATE

## 2020-08-07 LAB — CULTURE, BLOOD (SINGLE)
Culture: NO GROWTH
Special Requests: ADEQUATE

## 2020-08-07 NOTE — Telephone Encounter (Signed)
Lmom for pt to call me back. 

## 2020-08-08 ENCOUNTER — Telehealth: Payer: Self-pay | Admitting: *Deleted

## 2020-08-08 ENCOUNTER — Ambulatory Visit: Payer: Self-pay

## 2020-08-08 NOTE — Telephone Encounter (Signed)
Spoke with pt and she is scheduled for nurse triage visit by phone tomorrow 08/09/2020.

## 2020-08-08 NOTE — Telephone Encounter (Signed)
Spoke with pt and she is aware that we need to triage her to schedule an EGD.  Pt scheduled nurse visit by phone tomorrow.

## 2020-08-08 NOTE — Telephone Encounter (Signed)
Pt returning call. [phone] or [phone]

## 2020-08-08 NOTE — Telephone Encounter (Signed)
Left another vm for pt to call me back.

## 2020-08-09 ENCOUNTER — Other Ambulatory Visit: Payer: Self-pay

## 2020-08-09 ENCOUNTER — Ambulatory Visit (INDEPENDENT_AMBULATORY_CARE_PROVIDER_SITE_OTHER): Payer: Self-pay | Admitting: *Deleted

## 2020-08-09 DIAGNOSIS — R1310 Dysphagia, unspecified: Secondary | ICD-10-CM

## 2020-08-09 NOTE — Progress Notes (Signed)
Ok to schedule.  ASA II

## 2020-08-09 NOTE — Progress Notes (Addendum)
Gastroenterology Pre-Procedure Review  Request Date: 08/09/2020 Requesting Physician: Triage per Dr. Abbey Chatters, pt recently seen in hospital for dysphagia and desaturation from pneumonia  PATIENT REVIEW QUESTIONS: The patient responded to the following health history questions as indicated:    1. Diabetes Melitis: no 2. Joint replacements in the past 12 months: no 3. Major health problems in the past 3 months: yes, pneumonia 4. Has an artificial valve or MVP: no 5. Has a defibrillator: no 6. Has been advised in past to take antibiotics in advance of a procedure like teeth cleaning: no 7. Family history of colon cancer: yes, dad had rectal cancer: age 55, dad had larynx cancer 48 years ago 6. Alcohol Use: no 9. Illicit drug Use: no 10. History of sleep apnea: no  11. History of coronary artery or other vascular stents placed within the last 12 months: no 12. History of any prior anesthesia complications: no 13. There is no height or weight on file to calculate BMI. Ht: 5'3 Wt: 220 lbs     MEDICATIONS & ALLERGIES:    Patient reports the following regarding taking any blood thinners:   Plavix? no Aspirin? no Coumadin? no Brilinta? no Xarelto? no Eliquis? no Pradaxa? no Savaysa? no Effient? no  Patient confirms/reports the following medications:  Current Outpatient Medications  Medication Sig Dispense Refill  . albuterol (VENTOLIN HFA) 108 (90 Base) MCG/ACT inhaler Inhale 2 puffs into the lungs every 6 (six) hours as needed for wheezing or shortness of breath. 8 g 2  . ALPRAZolam (XANAX) 1 MG tablet Take 1 mg by mouth 2 (two) times daily.    Marland Kitchen atorvastatin (LIPITOR) 10 MG tablet Take 10 mg by mouth at bedtime.    . cyclobenzaprine (FLEXERIL) 10 MG tablet Take 10 mg by mouth as needed (Muscle spasms).     . gabapentin (NEURONTIN) 300 MG capsule Take 600 mg by mouth in the morning, at noon, and at bedtime.     . naproxen (NAPROSYN) 500 MG tablet Take 1 tablet (500 mg total) by mouth  daily as needed. (Patient taking differently: Take 500 mg by mouth as needed. )    . nicotine (NICODERM CQ - DOSED IN MG/24 HOURS) 21 mg/24hr patch Place 1 patch (21 mg total) onto the skin daily. 28 patch 1  . pantoprazole (PROTONIX) 40 MG tablet Take 1 tablet (40 mg total) by mouth daily. 30 tablet 1  . Vitamin D, Ergocalciferol, (DRISDOL) 1.25 MG (50000 UNIT) CAPS capsule Take 50,000 Units by mouth once a week.     No current facility-administered medications for this visit.    Patient confirms/reports the following allergies:  No Known Allergies  No orders of the defined types were placed in this encounter.   AUTHORIZATION INFORMATION Primary Insurance: [employer] Medicare,  ID [id number] ,  Group #: [id number] Pre-Cert / Auth required: Yes, approved online 22/48/2500-3/70/4888 Pre-Cert / Josem Kaufmann #: [id number]  SCHEDULE INFORMATION: Procedure has been scheduled as follows:  Date: 08/22/2020, Time: 10:30 Location: [facility] with Dr. Abbey Chatters  This Gastroenterology Pre-Precedure Review Form is being routed to the following provider(s): Walden Field, NP

## 2020-08-09 NOTE — Patient Instructions (Signed)
°  Procedure Day /Date: 08/22/2020 Arrival Time: To be told at Pre-op appt Location of Procedure: [facility]   PREPARATION FOR ENDOSCOPY  08/21/2020-You may have you regular food and drink until 6:00 pm.  Between 6:00 pm and midnight you can only have clear liquids, NO SOLID FOODS.  Nothing to eat or drink after midnight.    You can take your medication as normal (with a sip of water) unless something is listed below to hold or adjust.   Please stop the following medicine:  n/a    CLEAR LIQUIDS INCLUDE(see list below): NOTHING RED IN COLOR Water Jello  Ice Popsicles  Tea (sugar ok, no milk/cream) Powdered fruit flavored drinks  Coffee (sugar ok, no milk/cream) Gatorade  Juice: apple, white grape, white cranberry Lemonade   Soft Drinks(Coke etc)    White Grape Juice   Please note, on the day of your procedure you MUST be accompanied by an adult who is willing to assume responsibility for you at time of discharge. If you do not have such person with you, your procedure will have to be rescheduled.                                                                                                                     Please leave ALL jewelry at home prior to coming to the hospital for your procedure.

## 2020-08-09 NOTE — Progress Notes (Signed)
EGD triage needed per [place], NP's telephone note 08/04/2020.  Routing to [employer] in [place], NP's absence since ASAP.

## 2020-08-11 ENCOUNTER — Encounter: Payer: Self-pay | Admitting: *Deleted

## 2020-08-15 ENCOUNTER — Other Ambulatory Visit: Payer: Self-pay | Admitting: *Deleted

## 2020-08-17 ENCOUNTER — Other Ambulatory Visit: Payer: Self-pay

## 2020-08-17 ENCOUNTER — Encounter (HOSPITAL_COMMUNITY): Payer: Self-pay

## 2020-08-17 ENCOUNTER — Encounter (HOSPITAL_COMMUNITY)
Admission: RE | Admit: 2020-08-17 | Discharge: 2020-08-17 | Disposition: A | Discharge status: Home / Self Care | Payer: Medicare Other | Source: Ambulatory Visit | Attending: Internal Medicine | Admitting: Internal Medicine

## 2020-08-17 HISTORY — DX: Gastro-esophageal reflux disease without esophagitis: K21.9

## 2020-08-21 ENCOUNTER — Other Ambulatory Visit (HOSPITAL_COMMUNITY)
Admission: RE | Admit: 2020-08-21 | Discharge: 2020-08-21 | Disposition: A | Discharge status: Home / Self Care | Payer: Medicare Other | Source: Ambulatory Visit | Attending: Internal Medicine | Admitting: Internal Medicine

## 2020-08-21 ENCOUNTER — Other Ambulatory Visit (HOSPITAL_COMMUNITY): Payer: Medicare Other

## 2020-08-21 ENCOUNTER — Other Ambulatory Visit: Payer: Self-pay

## 2020-08-21 DIAGNOSIS — Z20822 Contact with and (suspected) exposure to covid-19: Secondary | ICD-10-CM | POA: Insufficient documentation

## 2020-08-21 DIAGNOSIS — Z01812 Encounter for preprocedural laboratory examination: Secondary | ICD-10-CM | POA: Insufficient documentation

## 2020-08-21 LAB — SARS CORONAVIRUS 2 (TAT 6-24 HRS): SARS Coronavirus 2: NEGATIVE

## 2020-08-22 ENCOUNTER — Ambulatory Visit (HOSPITAL_COMMUNITY): Payer: Medicare Other | Admitting: Anesthesiology

## 2020-08-22 ENCOUNTER — Encounter (HOSPITAL_COMMUNITY)
Admission: RE | Disposition: A | Discharge status: Home / Self Care | Payer: Self-pay | Source: Home / Self Care | Attending: Internal Medicine

## 2020-08-22 ENCOUNTER — Other Ambulatory Visit: Payer: Self-pay

## 2020-08-22 ENCOUNTER — Ambulatory Visit (HOSPITAL_COMMUNITY)
Admission: RE | Admit: 2020-08-22 | Discharge: 2020-08-22 | Disposition: A | Discharge status: Home / Self Care | Payer: Medicare Other | Attending: Internal Medicine | Admitting: Internal Medicine

## 2020-08-22 DIAGNOSIS — R1310 Dysphagia, unspecified: Secondary | ICD-10-CM | POA: Diagnosis present

## 2020-08-22 DIAGNOSIS — F17200 Nicotine dependence, unspecified, uncomplicated: Secondary | ICD-10-CM | POA: Insufficient documentation

## 2020-08-22 DIAGNOSIS — K3189 Other diseases of stomach and duodenum: Secondary | ICD-10-CM | POA: Diagnosis not present

## 2020-08-22 DIAGNOSIS — K219 Gastro-esophageal reflux disease without esophagitis: Secondary | ICD-10-CM | POA: Insufficient documentation

## 2020-08-22 DIAGNOSIS — J449 Chronic obstructive pulmonary disease, unspecified: Secondary | ICD-10-CM | POA: Diagnosis not present

## 2020-08-22 DIAGNOSIS — F32A Depression, unspecified: Secondary | ICD-10-CM | POA: Insufficient documentation

## 2020-08-22 DIAGNOSIS — K2950 Unspecified chronic gastritis without bleeding: Secondary | ICD-10-CM | POA: Insufficient documentation

## 2020-08-22 DIAGNOSIS — K222 Esophageal obstruction: Secondary | ICD-10-CM | POA: Insufficient documentation

## 2020-08-22 DIAGNOSIS — K766 Portal hypertension: Secondary | ICD-10-CM | POA: Diagnosis not present

## 2020-08-22 DIAGNOSIS — F418 Other specified anxiety disorders: Secondary | ICD-10-CM | POA: Diagnosis not present

## 2020-08-22 DIAGNOSIS — Z9981 Dependence on supplemental oxygen: Secondary | ICD-10-CM | POA: Diagnosis not present

## 2020-08-22 DIAGNOSIS — Z79899 Other long term (current) drug therapy: Secondary | ICD-10-CM | POA: Insufficient documentation

## 2020-08-22 HISTORY — PX: BIOPSY: SHX5522

## 2020-08-22 HISTORY — PX: BALLOON DILATION: SHX5330

## 2020-08-22 HISTORY — PX: ESOPHAGOGASTRODUODENOSCOPY (EGD) WITH PROPOFOL: SHX5813

## 2020-08-22 SURGERY — ESOPHAGOGASTRODUODENOSCOPY (EGD) WITH PROPOFOL
Anesthesia: General

## 2020-08-22 MED ORDER — LIDOCAINE VISCOUS HCL 2 % MT SOLN
15.0000 mL | Freq: Once | OROMUCOSAL | Status: AC
  Administered 2020-08-22: 15 mL via OROMUCOSAL

## 2020-08-22 MED ORDER — GLYCOPYRROLATE 0.2 MG/ML IJ SOLN
INTRAMUSCULAR | Status: AC
  Filled 2020-08-22: qty 1

## 2020-08-22 MED ORDER — GLYCOPYRROLATE 0.2 MG/ML IJ SOLN
0.2000 mg | Freq: Once | INTRAMUSCULAR | Status: AC
  Administered 2020-08-22: 0.2 mg via INTRAVENOUS

## 2020-08-22 MED ORDER — LACTATED RINGERS IV SOLN: INTRAVENOUS | Status: DC | PRN

## 2020-08-22 MED ORDER — PROPOFOL 10 MG/ML IV BOLUS
INTRAVENOUS | Status: DC | PRN
  Administered 2020-08-22: 20 mg via INTRAVENOUS
  Administered 2020-08-22 (×2): 60 mg via INTRAVENOUS

## 2020-08-22 MED ORDER — CHLORHEXIDINE GLUCONATE CLOTH 2 % EX PADS: 6.0000 | MEDICATED_PAD | Freq: Once | CUTANEOUS | Status: DC

## 2020-08-22 MED ORDER — STERILE WATER FOR IRRIGATION IR SOLN
Status: DC | PRN
  Administered 2020-08-22: 100 mL

## 2020-08-22 MED ORDER — LACTATED RINGERS IV SOLN: Freq: Once | INTRAVENOUS | Status: AC

## 2020-08-22 MED ORDER — LIDOCAINE VISCOUS HCL 2 % MT SOLN
OROMUCOSAL | Status: AC
  Filled 2020-08-22: qty 15

## 2020-08-22 NOTE — Discharge Instructions (Signed)
EGD Discharge instructions Please read the instructions outlined below and refer to this sheet in the next few weeks. These discharge instructions provide you with general information on caring for yourself after you leave the hospital. Your doctor may also give you specific instructions. While your treatment has been planned according to the most current medical practices available, unavoidable complications occasionally occur. If you have any problems or questions after discharge, please call your doctor. ACTIVITY  You may resume your regular activity but move at a slower pace for the next 24 hours.   Take frequent rest periods for the next 24 hours.   Walking will help expel (get rid of) the air and reduce the bloated feeling in your abdomen.   No driving for 24 hours (because of the anesthesia (medicine) used during the test).   You may shower.   Do not sign any important legal documents or operate any machinery for 24 hours (because of the anesthesia used during the test).  NUTRITION  Drink plenty of fluids.   You may resume your normal diet.   Begin with a light meal and progress to your normal diet.   Avoid alcoholic beverages for 24 hours or as instructed by your caregiver.  MEDICATIONS  You may resume your normal medications unless your caregiver tells you otherwise.  WHAT YOU CAN EXPECT TODAY  You may experience abdominal discomfort such as a feeling of fullness or "gas" pains.  FOLLOW-UP  Your doctor will discuss the results of your test with you.  SEEK IMMEDIATE MEDICAL ATTENTION IF ANY OF THE FOLLOWING OCCUR:  Excessive nausea (feeling sick to your stomach) and/or vomiting.   Severe abdominal pain and distention (swelling).   Trouble swallowing.   Temperature over 101 F (37.8 C).   Rectal bleeding or vomiting of blood.   Your EGD showed a esophageal stricture which was mild.  I did use a balloon to dilate this.  Hopefully this will help with your  swallowing.  You have inflammation throughout your stomach as well.  I biopsied this to rule out infection with a bacteria called H. pylori.  Await pathology results, my office will contact you.  Follow-up with GI in 3 months or sooner if needed.   I hope you have a great rest of your week!  Elon Alas. Abbey Chatters, D.O. Gastroenterology and Hepatology [facility]

## 2020-08-22 NOTE — H&P (Signed)
Primary Care Physician:  Bridget Hartshorn, NP Primary Gastroenterologist:  Dr. Abbey Chatters  Pre-Procedure History & Physical: HPI:  Lydia Alexander is a 59 y.o. female is here for an EGD to be performed for dysphagia   Past Medical History:  Diagnosis Date   Back pain    Depression    GERD (gastroesophageal reflux disease)     Past Surgical History:  Procedure Laterality Date   CHOLECYSTECTOMY     DILATION AND CURETTAGE OF UTERUS     TUBAL LIGATION      Prior to Admission medications   Medication Sig Start Date End Date Taking? Authorizing Provider  albuterol (VENTOLIN HFA) 108 (90 Base) MCG/ACT inhaler Inhale 2 puffs into the lungs every 6 (six) hours as needed for wheezing or shortness of breath. 08/05/20  Yes Shelly Coss, MD  ALPRAZolam Duanne Moron) 1 MG tablet Take 1 mg by mouth 2 (two) times daily. 07/21/20  Yes [provider]  atorvastatin (LIPITOR) 10 MG tablet Take 10 mg by mouth at bedtime. 06/16/20 09/14/20 Yes [provider]  Cyanocobalamin (B-12) 2500 MCG TABS Take 2,500 mcg by mouth daily.   Yes [provider]  cyclobenzaprine (FLEXERIL) 10 MG tablet Take 10 mg by mouth 2 (two) times daily as needed for muscle spasms.  05/30/20  Yes [provider]  gabapentin (NEURONTIN) 300 MG capsule Take 600 mg by mouth in the morning, at noon, and at bedtime.  06/05/17 08/23/20 Yes [provider]  naproxen (NAPROSYN) 500 MG tablet Take 1 tablet (500 mg total) by mouth daily as needed. Patient taking differently: Take 500 mg by mouth daily as needed for moderate pain.  08/05/20  Yes Shelly Coss, MD  nicotine (NICODERM CQ - DOSED IN MG/24 HOURS) 21 mg/24hr patch Place 1 patch (21 mg total) onto the skin daily. 08/06/20  Yes Shelly Coss, MD  pantoprazole (PROTONIX) 40 MG tablet Take 1 tablet (40 mg total) by mouth daily. 08/06/20  Yes Shelly Coss, MD  Vitamin D, Ergocalciferol, (DRISDOL) 1.25 MG (50000 UNIT) CAPS capsule Take 50,000  Units by mouth once a week. 06/22/20  Yes [provider]  Cyanocobalamin (VITAMIN B 12 PO) Take by mouth daily. Patient not taking: Reported on 08/16/2020    [provider]    Allergies as of 08/09/2020   (No Known Allergies)    No family history on file.  Social History   Socioeconomic History   Marital status: Widowed    Spouse name: Not on file   Number of children: Not on file   Years of education: Not on file   Highest education level: Not on file  Occupational History   Not on file  Tobacco Use   Smoking status: Current Every Day Smoker    Packs/day: 1.50    Years: 40.00    Pack years: 60.00    Types: Cigarettes   Smokeless tobacco: Never Used   Tobacco comment: has had 2 cig in 2 weeks as of 08/17/2020  Vaping Use   Vaping Use: Never used  Substance and Sexual Activity   Alcohol use: Yes    Comment: occ   Drug use: Never   Sexual activity: Not Currently  Other Topics Concern   Not on file  Social History Narrative   Not on file   Social Determinants of Health   Financial Resource Strain:    Difficulty of Paying Living Expenses: Not on file  Food Insecurity:    Worried About Charity fundraiser in  the Last Year: Not on file   Ran Out of Food in the Last Year: Not on file  Transportation Needs:    Lack of Transportation (Medical): Not on file   Lack of Transportation (Non-Medical): Not on file  Physical Activity:    Days of Exercise per Week: Not on file   Minutes of Exercise per Session: Not on file  Stress:    Feeling of Stress : Not on file  Social Connections:    Frequency of Communication with Friends and Family: Not on file   Frequency of Social Gatherings with Friends and Family: Not on file   Attends Religious Services: Not on file   Active Member of Clubs or Organizations: Not on file   Attends Archivist Meetings: Not on file   Marital Status: Not on file  Intimate Partner Violence:     Fear of Current or Ex-Partner: Not on file   Emotionally Abused: Not on file   Physically Abused: Not on file   Sexually Abused: Not on file    Review of Systems: See HPI, otherwise negative ROS  Impression/Plan: Lydia Alexander is here for an EGD to be performed for dysphagia  Risks, benefits, limitations, imponderables and alternatives regarding EGD have been reviewed with the patient. Questions have been answered. All parties agreeable.

## 2020-08-22 NOTE — Transfer of Care (Signed)
Immediate Anesthesia Transfer of Care Note  Patient: Lydia Alexander  Procedure(s) Performed: ESOPHAGOGASTRODUODENOSCOPY (EGD) WITH PROPOFOL (N/A ) BALLOON DILATION (N/A ) BIOPSY  Patient Location: PACU  Anesthesia Type:General  Level of Consciousness: awake, alert  and patient cooperative  Airway & Oxygen Therapy: Patient Spontanous Breathing and Patient connected to nasal cannula oxygen  Post-op Assessment: Report given to RN and Post -op Vital signs reviewed and stable  Post vital signs: Reviewed and stable  Last Vitals:  Vitals Value Taken Time  BP    Temp    Pulse    Resp    SpO2      Last Pain:  Vitals:   08/22/20 1051  TempSrc:   PainSc: 0-No pain      Patients Stated Pain Goal: 5 (97/28/20 6015)  Complications: No complications documented.

## 2020-08-22 NOTE — Anesthesia Preprocedure Evaluation (Addendum)
Anesthesia Evaluation  Patient identified by MRN, date of birth, ID band Patient awake    Reviewed: Allergy & Precautions, NPO status , Patient's Chart, lab work & pertinent test results  History of Anesthesia Complications Negative for: history of anesthetic complications  Airway Mallampati: II  TM Distance: >3 FB Neck ROM: Full    Dental  (+) Dental Advisory Given, Poor Dentition, Missing, Chipped   Pulmonary pneumonia, resolved, COPD,  oxygen dependent, Current Smoker and Patient abstained from smoking.,    Pulmonary exam normal breath sounds clear to auscultation       Cardiovascular Normal cardiovascular exam Rhythm:Regular Rate:Normal     Neuro/Psych PSYCHIATRIC DISORDERS Anxiety Depression    GI/Hepatic Neg liver ROS, GERD  Medicated,  Endo/Other  negative endocrine ROS  Renal/GU negative Renal ROS     Musculoskeletal Chronic back pain   Abdominal   Peds  Hematology negative hematology ROS (+)   Anesthesia Other Findings   Reproductive/Obstetrics negative OB ROS                          Anesthesia Physical Anesthesia Plan  ASA: IV  Anesthesia Plan: General   Post-op Pain Management:    Induction: Intravenous  PONV Risk Score and Plan: TIVA  Airway Management Planned: Nasal Cannula and Natural Airway  Additional Equipment:   Intra-op Plan:   Post-operative Plan:   Informed Consent: I have reviewed the patients History and Physical, chart, labs and discussed the procedure including the risks, benefits and alternatives for the proposed anesthesia with the patient or authorized representative who has indicated his/her understanding and acceptance.     Dental advisory given  Plan Discussed with: CRNA and Surgeon  Anesthesia Plan Comments:        Anesthesia Quick Evaluation

## 2020-08-22 NOTE — Op Note (Signed)
[facility] Patient Name: Lydia Alexander Procedure Date: 08/22/2020 10:47 AM MRN: [id number] Date of Birth: Sep 19, 1961 Attending MD: Elon Alas. Abbey Chatters DO CSN: [id number] Age: 59 Admit Type: Outpatient Procedure:                Upper GI endoscopy Indications:              Dysphagia Providers:                Elon Alas. Abbey Chatters, DO, Lambert Mody, Crystal                            Page, Raphael Gibney, Technician Referring MD:              Medicines:                See the Anesthesia note for documentation of the                            administered medications Complications:            No immediate complications. Estimated Blood Loss:     Estimated blood loss was minimal. Procedure:                Pre-Anesthesia Assessment:                           - The anesthesia plan was to use monitored                            anesthesia care (MAC).                           After obtaining informed consent, the endoscope was                            passed under direct vision. Throughout the                            procedure, the patient's blood pressure, pulse, and                            oxygen saturations were monitored continuously. The                            GIF-H190 ([id number]) scope was introduced through the                            mouth, and advanced to the second part of duodenum.                            The upper GI endoscopy was accomplished without                            difficulty. The patient tolerated the procedure                            well. Scope In: 10:58:09 AM Scope  Out: 11:03:04 AM Total Procedure Duration: 0 hours 4 minutes 55 seconds  Findings:      One benign-appearing, intrinsic mild stenosis was found in the lower       third of the esophagus. The stenosis was traversed. A TTS dilator was       passed through the scope. Dilation with an 18-19-20 mm balloon dilator       was performed to 20 mm. The dilation site was examined  and showed       moderate improvement in luminal narrowing.      Localized moderate inflammation characterized by erythema was found in       the gastric antrum. Biopsies were taken with a cold forceps for       Helicobacter pylori testing.      ?Mild portal hypertensive gastropathy was found in the entire examined       stomach.      The duodenal bulb, first portion of the duodenum and second portion of       the duodenum were normal. Impression:               - Benign-appearing esophageal stenosis. Dilated.                           - Gastritis. Biopsied.                           - Portal hypertensive gastropathy.                           - Normal duodenal bulb, first portion of the                            duodenum and second portion of the duodenum. Moderate Sedation:      Per Anesthesia Care Recommendation:           - Patient has a contact number available for                            emergencies. The signs and symptoms of potential                            delayed complications were discussed with the                            patient. Return to normal activities tomorrow.                            Written discharge instructions were provided to the                            patient.                           - Resume previous diet.                           - Continue present medications.                           -  Await pathology results.                           - Repeat upper endoscopy PRN for retreatment.                           - Return to GI clinic in 3 months. Procedure Code(s):        --- Professional ---                           [phone], Esophagogastroduodenoscopy, flexible,                            transoral; with transendoscopic balloon dilation of                            esophagus (less than 30 mm diameter)                           43239, 59, Esophagogastroduodenoscopy, flexible,                            transoral; with biopsy, single or  multiple Diagnosis Code(s):        --- Professional ---                           K22.2, Esophageal obstruction                           K29.70, Gastritis, unspecified, without bleeding                           K76.6, Portal hypertension                           K31.89, Other diseases of stomach and duodenum                           R13.10, Dysphagia, unspecified CPT copyright 2019 American Medical Association. All rights reserved. The codes documented in this report are preliminary and upon coder review may  be revised to meet current compliance requirements. Elon Alas. Abbey Chatters, DO [place], DO 08/22/2020 11:06:07 AM This report has been signed electronically. Number of Addenda: 0

## 2020-08-22 NOTE — Anesthesia Procedure Notes (Signed)
Date/Time: 08/22/2020 10:49 AM Performed by: Vista Deck, CRNA Pre-anesthesia Checklist: Patient identified, Emergency Drugs available, Suction available, Timeout performed and Patient being monitored Patient Re-evaluated:Patient Re-evaluated prior to induction Oxygen Delivery Method: Non-rebreather mask

## 2020-08-22 NOTE — Anesthesia Postprocedure Evaluation (Signed)
Anesthesia Post Note  Patient: Lydia Alexander  Procedure(s) Performed: ESOPHAGOGASTRODUODENOSCOPY (EGD) WITH PROPOFOL (N/A ) BALLOON DILATION (N/A ) BIOPSY  Patient location during evaluation: PACU Anesthesia Type: General Level of consciousness: awake and alert and patient cooperative Pain management: satisfactory to patient Vital Signs Assessment: post-procedure vital signs reviewed and stable Respiratory status: spontaneous breathing Cardiovascular status: stable Postop Assessment: no apparent nausea or vomiting Anesthetic complications: no   No complications documented.   Last Vitals:  Vitals:   08/22/20 1130 08/22/20 1141  BP: 118/79 123/89  Pulse: 71 70  Resp: 20 18  Temp:  36.6 C  SpO2: 95% 96%    Last Pain:  Vitals:   08/22/20 1141  TempSrc: Oral  PainSc: 0-No pain                 Chad Donoghue

## 2020-08-23 ENCOUNTER — Other Ambulatory Visit: Payer: Self-pay

## 2020-08-24 LAB — SURGICAL PATHOLOGY

## 2020-08-25 ENCOUNTER — Encounter (HOSPITAL_COMMUNITY): Payer: Self-pay | Admitting: Internal Medicine

## 2020-09-26 ENCOUNTER — Other Ambulatory Visit: Payer: Self-pay

## 2020-09-26 ENCOUNTER — Ambulatory Visit (HOSPITAL_COMMUNITY)
Admission: RE | Admit: 2020-09-26 | Discharge: 2020-09-26 | Disposition: A | Discharge status: Home / Self Care | Payer: Medicare Other | Source: Ambulatory Visit | Attending: Adult Health Nurse Practitioner | Admitting: Adult Health Nurse Practitioner

## 2020-09-26 ENCOUNTER — Other Ambulatory Visit (HOSPITAL_COMMUNITY): Payer: Self-pay | Admitting: Adult Health Nurse Practitioner

## 2020-09-26 DIAGNOSIS — J189 Pneumonia, unspecified organism: Secondary | ICD-10-CM | POA: Diagnosis not present

## 2020-11-15 ENCOUNTER — Ambulatory Visit: Payer: Medicare Other | Admitting: Pulmonary Disease

## 2020-11-15 ENCOUNTER — Other Ambulatory Visit: Payer: Self-pay

## 2020-11-15 ENCOUNTER — Encounter: Payer: Self-pay | Admitting: Pulmonary Disease

## 2020-11-15 VITALS — BP 122/80 | HR 93 | Temp 98.4°F | Ht 63.0 in | Wt 208.2 lb

## 2020-11-15 DIAGNOSIS — Z720 Tobacco use: Secondary | ICD-10-CM

## 2020-11-15 DIAGNOSIS — R0600 Dyspnea, unspecified: Secondary | ICD-10-CM

## 2020-11-15 DIAGNOSIS — R0609 Other forms of dyspnea: Secondary | ICD-10-CM

## 2020-11-15 DIAGNOSIS — K224 Dyskinesia of esophagus: Secondary | ICD-10-CM

## 2020-11-15 DIAGNOSIS — G4734 Idiopathic sleep related nonobstructive alveolar hypoventilation: Secondary | ICD-10-CM | POA: Diagnosis not present

## 2020-11-15 DIAGNOSIS — R0683 Snoring: Secondary | ICD-10-CM

## 2020-11-15 MED ORDER — SPIRIVA RESPIMAT 2.5 MCG/ACT IN AERS: 1.0000 | INHALATION_SPRAY | Freq: Every day | RESPIRATORY_TRACT | 0 refills | Status: DC

## 2020-11-15 NOTE — Progress Notes (Signed)
Patient seen in the office today and instructed on use of Spiriva 2.38mg.  Patient expressed understanding and demonstrated technique.  CBenetta [facility]ast [facility]

## 2020-11-15 NOTE — Addendum Note (Signed)
Addended by: Valerie Salts on: 11/15/2020 02:55 PM   Modules accepted: Orders

## 2020-11-15 NOTE — Patient Instructions (Addendum)
Start spiriva respimat 2.73mg 1 puff daily and call [place] you notice improvement in your breathing. We will send in prescription if you notice improvement.  Continue as needed albuterol use.  We will schedule you for split night sleep study for concern of sleep apnea.  [facility] and dysph[facility]ia.  We will schedule you for pulmonary function testing in 3 months with your follow up visit.

## 2020-11-15 NOTE — Progress Notes (Signed)
Synopsis: Referred in 11/2020 for evaluation of COPD and pneumonia by Lars Mage, NP.  Subjective:   PATIENT ID: Lydia Alexander GENDER: female DOB: [birth year], MRN: [id number]   HPI  Chief Complaint  Patient presents with  . Consult    Referred by PCP for history of COPD and PNA. States she was diagnosed with PNA back in September 2021. Normally uses 2L 24/7. Denies any increased SOB except for walking.     Lydia Alexander is a 60 year old woman, daily smoker with dysphagia, GERD, and recent pneumonia who is referred to pulmonary clinic for evaluation of COPD.    She had hospital admission 08/02/20 to 08/05/20 for community acquired pneumonia and hypoxemia. She was treated with azithromycin and ceftriaxone initially and the ceftriaxone was transitioned to cefdinir upon discharge. She followed up with her primary care on 08/15/20 with improvement in her clinical status. She was discharged on supplemental oxygen which she currently only uses at night. She was continued on as needed albuterol for suspected COPD based on her significant smoking history. She was also provided with nicotine replacement therapy via patch which she has not used. She continues to smoke 5-15 cigarettes per day. She has smoked for 40 years and previously smoked 1-2 packs per day before her husband died 36 years ago.   She has exertional shortness of breath. She has occasional wheezing. She does snore at night and there were prior concerns for sleep apnea by other providers but she had a period of time without health insurance and testing was not pursued.   She also complains of trouble swallowing by solids and liquids. She feels like food or drink can get stuck in her throat and her chest. She had EGD 08/03/20 while hospitalized which was notable for an esophageal stenosis that was dilated.   She had an EGD in 08/2020 after hospitalization for the dysphagia where an esophageal stenosis was noted and treated with  balloon dilation. She noted some improvement in her swallowing but still has trouble in her throat. She had barium swallow done in the hospital which was concerning for esophageal dysmotility.    No prior hospitalizations for her breathing.  Past Medical History:  Diagnosis Date  . Back pain   . Depression   . GERD (gastroesophageal reflux disease)      History reviewed. No pertinent family history.   Social History   Socioeconomic History  . Marital status: Widowed    Spouse name: Not on file  . Number of children: Not on file  . Years of education: Not on file  . Highest education level: Not on file  Occupational History  . Not on file  Tobacco Use  . Smoking status: Current Every Day Smoker    Packs/day: 1.50    Years: 40.00    Pack years: 60.00    Types: Cigarettes  . Smokeless tobacco: Never Used  . Tobacco comment: has had 2 cig in 2 weeks as of 08/17/2020  Vaping Use  . Vaping Use: Never used  Substance and Sexual Activity  . Alcohol use: Yes    Comment: occ  . Drug use: Never  . Sexual activity: Not Currently  Other Topics Concern  . Not on file  Social History Narrative  . Not on file   Social Determinants of Health   Financial Resource Strain: Not on file  Food Insecurity: Not on file  Transportation Needs: Not on file  Physical Activity: Not on file  Stress: Not on  file  Social Connections: Not on file  Intimate Partner Violence: Not on file     No Known Allergies   Outpatient Medications Prior to Visit  Medication Sig Dispense Refill  . albuterol (VENTOLIN HFA) 108 (90 Base) MCG/ACT inhaler Inhale 2 puffs into the lungs every 6 (six) hours as needed for wheezing or shortness of breath. 8 g 2  . ALPRAZolam (XANAX) 1 MG tablet Take 1 mg by mouth 2 (two) times daily.    . Cyanocobalamin (B-12) 2500 MCG TABS Take 2,500 mcg by mouth daily.    . cyclobenzaprine (FLEXERIL) 10 MG tablet Take 10 mg by mouth 2 (two) times daily as needed for muscle  spasms.     . naproxen (NAPROSYN) 500 MG tablet Take 1 tablet (500 mg total) by mouth daily as needed. (Patient taking differently: Take 500 mg by mouth daily as needed for moderate pain.)    . nicotine (NICODERM CQ - DOSED IN MG/24 HOURS) 21 mg/24hr patch Place 1 patch (21 mg total) onto the skin daily. 28 patch 1  . pantoprazole (PROTONIX) 40 MG tablet Take 1 tablet (40 mg total) by mouth daily. 30 tablet 1  . Vitamin D, Ergocalciferol, (DRISDOL) 1.25 MG (50000 UNIT) CAPS capsule Take 50,000 Units by mouth once a week.    Marland Kitchen atorvastatin (LIPITOR) 10 MG tablet Take 10 mg by mouth at bedtime.    . gabapentin (NEURONTIN) 300 MG capsule Take 600 mg by mouth in the morning, at noon, and at bedtime.      No facility-administered medications prior to visit.    Review of Systems  Constitutional: Negative for chills, fever, malaise/fatigue and weight loss.  HENT: Negative for congestion, sinus pain and sore throat.   Eyes: Negative.   Respiratory: Positive for shortness of breath and wheezing. Negative for cough, hemoptysis and sputum production.   Cardiovascular: Negative for chest pain, palpitations, orthopnea, claudication and leg swelling.  Gastrointestinal: Negative for abdominal pain, heartburn, nausea and vomiting.  Genitourinary: Negative.   Musculoskeletal: Negative.   Skin: Negative for rash.  Neurological: Negative for dizziness, weakness and headaches.  Endo/Heme/Allergies: Negative.   Psychiatric/Behavioral: Negative.    Objective:   Vitals:   11/15/20 1339  BP: 122/80  Pulse: 93  Temp: 98.4 F (36.9 C)  TempSrc: Temporal  SpO2: 98%  Weight: 208 lb 3.2 oz (94.4 kg)  Height: 5' 3"  (1.6 m)     Physical Exam Constitutional:      General: She is not in acute distress.    Appearance: She is obese.  HENT:     Head: Normocephalic and atraumatic.     Mouth/Throat:     Mouth: Mucous membranes are moist.     Pharynx: Oropharynx is clear.  Eyes:     General: No scleral  icterus.    Conjunctiva/sclera: Conjunctivae normal.     Pupils: Pupils are equal, round, and reactive to light.  Cardiovascular:     Rate and Rhythm: Normal rate and regular rhythm.     Pulses: Normal pulses.     Heart sounds: Normal heart sounds. No murmur heard.   Pulmonary:     Effort: Pulmonary effort is normal.     Breath sounds: Decreased air movement present. No wheezing, rhonchi or rales.  Abdominal:     General: Bowel sounds are normal.     Palpations: Abdomen is soft.  Musculoskeletal:     Right lower leg: No edema.     Left lower leg: No edema.  Skin:  General: Skin is warm and dry.     Capillary Refill: Capillary refill takes less than 2 seconds.  Neurological:     General: No focal deficit present.     Mental Status: She is alert.  Psychiatric:        Mood and Affect: Mood normal.        Behavior: Behavior normal.        Thought Content: Thought content normal.        Judgment: Judgment normal.     CBC    Component Value Date/Time   WBC 6.3 08/03/2020 0615   RBC 3.88 08/03/2020 0615   HGB 13.0 08/03/2020 0615   HCT 42.9 08/03/2020 0615   PLT 155 08/03/2020 0615   MCV 110.6 (H) 08/03/2020 0615   MCH 33.5 08/03/2020 0615   MCHC 30.3 08/03/2020 0615   RDW 14.4 08/03/2020 0615   LYMPHSABS 1.5 08/03/2020 0615   MONOABS 0.5 08/03/2020 0615   EOSABS 0.1 08/03/2020 0615   BASOSABS 0.0 08/03/2020 0615   BMP Latest Ref Rng & Units 08/03/2020 08/02/2020 03/18/2020  Glucose 70 - 99 mg/dL 109(H) 101(H) 122(H)  BUN 6 - 20 mg/dL 10 12 10   Creatinine 0.44 - 1.00 mg/dL 0.62 0.74 0.76  Sodium 135 - 145 mmol/L 140 140 141  Potassium 3.5 - 5.1 mmol/L 4.1 3.8 3.5  Chloride 98 - 111 mmol/L 105 103 105  CO2 22 - 32 mmol/L 29 29 25   Calcium 8.9 - 10.3 mg/dL 8.1(L) 8.5(L) 8.6(L)   Chest imaging: CXR 09/26/20 1. No evidence of acute cardiopulmonary disease. 2. Nodular density along the LEFT heart border of uncertain significance. This could represent scarring or  residual airspace disease from reported recent pneumonia. Suggest 4-6 week follow-up to ensure resolution and exclude underlying nodule.  CXR 08/02/20 Prominence of the cardiomediastinal silhouette may be secondary to mild hypoinflation and AP technique. Patchy left basilar opacities. No pneumothorax or pleural effusion. No acute osseous abnormality.  PFT: No flowsheet data found.  Assessment & Plan:   Dyspnea on exertion - Plan: Pulmonary Function Test  Tobacco use - Plan: Ambulatory Referral for Lung Cancer Scre  Nocturnal oxygen desaturation  Snoring - Plan: Split night study  Esophageal dysmotility - Plan: Ambulatory referral to Gastroenterology  Discussion: Lydia Alexander is a 60 year old woman, daily smoker with dysphagia, GERD, and recent pneumonia who is referred to pulmonary clinic for evaluation of COPD.    She has significant smoking history as well as clinical history of wheezing and dyspnea concerning for obstructive lung disease. We will start her on a sample of spiriva 2.72mg 1 puff daily and monitor for improvement. If she notices benefit then we will send in prescription. We will obtain pulmonary function tests at follow up visit in 3 months. She can continue as needed albuterol inhaler.   She does qualify for lung cancer screening and she has agreed to being referred to our screening program for surveillance. Referral has been placed.   She has history of nocturnal oxygen desaturations and snoring. We will send her for split night sleep study as she is currently using 2L of O2 at night when sleeping.   She also has esophageal motility noted on barium swallow and esophageal stenosis noted on EGD in October 2021 with no further follow up noted with Gastroenterology. I will refer her to [facility] evaluation of esophageal dysmotility and dysphagia.   Follow up in 3 months.  JFreda Jackson MD [facility]  Office: [phone]  See Amion  for Pager Details    Current Outpatient Medications:  .  albuterol (VENTOLIN HFA) 108 (90 Base) MCG/ACT inhaler, Inhale 2 puffs into the lungs every 6 (six) hours as needed for wheezing or shortness of breath., Disp: 8 g, Rfl: 2 .  ALPRAZolam (XANAX) 1 MG tablet, Take 1 mg by mouth 2 (two) times daily., Disp: , Rfl:  .  Cyanocobalamin (B-12) 2500 MCG TABS, Take 2,500 mcg by mouth daily., Disp: , Rfl:  .  cyclobenzaprine (FLEXERIL) 10 MG tablet, Take 10 mg by mouth 2 (two) times daily as needed for muscle spasms. , Disp: , Rfl:  .  naproxen (NAPROSYN) 500 MG tablet, Take 1 tablet (500 mg total) by mouth daily as needed. (Patient taking differently: Take 500 mg by mouth daily as needed for moderate pain.), Disp: , Rfl:  .  nicotine (NICODERM CQ - DOSED IN MG/24 HOURS) 21 mg/24hr patch, Place 1 patch (21 mg total) onto the skin daily., Disp: 28 patch, Rfl: 1 .  pantoprazole (PROTONIX) 40 MG tablet, Take 1 tablet (40 mg total) by mouth daily., Disp: 30 tablet, Rfl: 1 .  Vitamin D, Ergocalciferol, (DRISDOL) 1.25 MG (50000 UNIT) CAPS capsule, Take 50,000 Units by mouth once a week., Disp: , Rfl:  .  atorvastatin (LIPITOR) 10 MG tablet, Take 10 mg by mouth at bedtime., Disp: , Rfl:  .  gabapentin (NEURONTIN) 300 MG capsule, Take 600 mg by mouth in the morning, at noon, and at bedtime. , Disp: , Rfl:

## 2020-11-22 ENCOUNTER — Other Ambulatory Visit: Payer: Self-pay | Admitting: *Deleted

## 2020-11-22 DIAGNOSIS — F17210 Nicotine dependence, cigarettes, uncomplicated: Secondary | ICD-10-CM

## 2020-11-22 DIAGNOSIS — Z87891 Personal history of nicotine dependence: Secondary | ICD-10-CM

## 2020-11-23 ENCOUNTER — Encounter: Payer: Self-pay | Admitting: *Deleted

## 2020-11-23 ENCOUNTER — Encounter: Payer: Self-pay | Admitting: Nurse Practitioner

## 2020-11-23 ENCOUNTER — Telehealth: Payer: Self-pay | Admitting: *Deleted

## 2020-11-23 ENCOUNTER — Other Ambulatory Visit: Payer: Self-pay

## 2020-11-23 ENCOUNTER — Ambulatory Visit: Payer: Medicare Other | Admitting: Nurse Practitioner

## 2020-11-23 VITALS — BP 129/81 | HR 90 | Temp 96.8°F | Ht 63.0 in | Wt 209.8 lb

## 2020-11-23 DIAGNOSIS — K766 Portal hypertension: Secondary | ICD-10-CM | POA: Diagnosis not present

## 2020-11-23 DIAGNOSIS — Z800 Family history of malignant neoplasm of digestive organs: Secondary | ICD-10-CM | POA: Insufficient documentation

## 2020-11-23 DIAGNOSIS — R1319 Other dysphagia: Secondary | ICD-10-CM

## 2020-11-23 DIAGNOSIS — K3189 Other diseases of stomach and duodenum: Secondary | ICD-10-CM | POA: Diagnosis not present

## 2020-11-23 MED ORDER — PANTOPRAZOLE SODIUM 40 MG PO TBEC: 40.0000 mg | DELAYED_RELEASE_TABLET | Freq: Every day | ORAL | 1 refills | Status: DC

## 2020-11-23 NOTE — Patient Instructions (Addendum)
Your health issues we discussed today were:   Dysphagia (swallowing difficulties): 1. I am glad you are doing better! 2. I have sent a refill of Protonix 40 mg to your pharmacy.  Continue to take this daily 3. Call us if you have any worsening or recurrent symptoms 4. Remember to continue to eat small bites, softer foods, chew thoroughly  Unusual appearing stomach lining: 1. Have your labs completed when you are able to 2. Somebody from radiology or from our office will call you to schedule your ultrasound of your abdomen and your liver 3. Further recommendations will follow  Need for colonoscopy/family history of rectal cancer: 1. We will schedule your colonoscopy for you 2. Further recommendations will follow your colonoscopy 3. Let us know if you have any obvious rectal bleeding  Overall I recommend:  1. Continue other current medications 2. Return for follow-up in 3 months 3. Calls for any questions or concerns   ---------------------------------------------------------------  I am glad you have gotten your COVID-19 vaccination!  Even though you are fully vaccinated you should continue to follow CDC and state/local guidelines.  ---------------------------------------------------------------   At [facility] we value your feedback. You may receive a survey about your visit today. Please share your experience as we strive to create trusting relationships with our patients to provide genuine, compassionate, quality care.  We appreciate your understanding and patience as we review any laboratory studies, imaging, and other diagnostic tests that are ordered as we care for you. Our office policy is 5 business days for review of these results, and any emergent or urgent results are addressed in a timely manner for your best interest. If you do not hear from our office in 1 week, please contact us.   We also encourage the use of MyChart, which contains your medical  information for your review as well. If you are not enrolled in this feature, an access code is on this after visit summary for your convenience. Thank you for allowing [place] to be involved in your care.  It was great to see you today!  I hope you have a great winter and stay warm and safe this weekend!!

## 2020-11-23 NOTE — Progress Notes (Signed)
Referring Provider: Bridget Hartshorn, NP Primary Care Physician:  Bridget Hartshorn, NP Primary GI:  Dr. Abbey Chatters  Chief Complaint  Patient presents with  . Dysphagia    Some problems swallowing but not as bad since procedure    HPI:   Lydia Alexander is a 60 y.o. female who presents for postprocedural follow-up.  The patient was admitted to the hospital from 08/02/2020 through 08/05/2020 for acute hypoxic respiratory failure due to community-acquired pneumonia.  During her hospitalization she noted solid food and pill dysphagia.  Barium pill esophagram did not note structural issues but did mention esophageal dysmotility disorder.  Recommended outpatient EGD due to compromised pulmonary status.  She was started on daily PPI for now.    She was subsequently triaged as an outpatient and underwent endoscopy 08/22/2020 which found benign-appearing esophageal stenosis status post dilation, gastritis s/p biopsy, portal hypertensive gastropathy, normal duodenum.  Recommended repeat EGD as needed.  Follow-up in the GI clinic in 3 months.  Review of the chart finds no recent abdominal imaging.  LFTs were normal on CMP 08/03/2020.  No mention in historical record of cirrhosis. Platelets at last CBC are low/normal at 155.  Today she states she is doing okay overall. Breathing is better, but still on oxygen. Meds were changed to add Spiriva. Dysphagia is improved since EGD/dilation. Taking time eating and chewing thoroughly. Eating softer foods and cutting into smaller pieces. She has had some blood on the toilet tissue, but likely vaginal bleeding. Has transabdominal/transvaginal U/S with results in [place]. Denies abdominal pain, N/V, melena, fever, chills, unintentional weight loss. Last colonoscopy was about 10 or more years ago. Her father passed away from rectal cancer n his 39s.   Past Medical History:  Diagnosis Date  . Back pain   . Depression   . GERD (gastroesophageal reflux  disease)     Past Surgical History:  Procedure Laterality Date  . BALLOON DILATION N/A 08/22/2020   Procedure: BALLOON DILATION;  Surgeon: Eloise Harman, DO;  [facility];  Service: Endoscopy;  Laterality: N/A;  . BIOPSY  08/22/2020   Procedure: BIOPSY;  Surgeon: Eloise Harman, DO;  Location: [facility];  Service: Endoscopy;;  . CHOLECYSTECTOMY    . DILATION AND CURETTAGE OF UTERUS    . ESOPHAGOGASTRODUODENOSCOPY (EGD) WITH PROPOFOL N/A 08/22/2020   Procedure: ESOPHAGOGASTRODUODENOSCOPY (EGD) WITH PROPOFOL;  Surgeon: Eloise Harman, DO;  Location: [facility];  Service: Endoscopy;  Laterality: N/A;  10:30  . TUBAL LIGATION      Current Outpatient Medications  Medication Sig Dispense Refill  . ALPRAZolam (XANAX) 1 MG tablet Take 1 mg by mouth 2 (two) times daily.    Marland Kitchen atorvastatin (LIPITOR) 10 MG tablet Take 10 mg by mouth at bedtime.    . Cyanocobalamin (B-12) 2500 MCG TABS Take 2,500 mcg by mouth daily.    . cyclobenzaprine (FLEXERIL) 10 MG tablet Take 10 mg by mouth 2 (two) times daily as needed for muscle spasms.     Marland Kitchen gabapentin (NEURONTIN) 300 MG capsule Take 600 mg by mouth in the morning, at noon, and at bedtime.     . naproxen (NAPROSYN) 500 MG tablet Take 1 tablet (500 mg total) by mouth daily as needed. (Patient taking differently: Take 500 mg by mouth daily as needed for moderate pain.)    . Tiotropium Bromide Monohydrate (SPIRIVA RESPIMAT) 2.5 MCG/ACT AERS Inhale 1 puff into the lungs daily. 4 g 0  . Vitamin D, Ergocalciferol, (  DRISDOL) 1.25 MG (50000 UNIT) CAPS capsule Take 50,000 Units by mouth once a week.    Marland Kitchen albuterol (VENTOLIN HFA) 108 (90 Base) MCG/ACT inhaler Inhale 2 puffs into the lungs every 6 (six) hours as needed for wheezing or shortness of breath. (Patient not taking: Reported on 11/23/2020) 8 g 2  . nicotine (NICODERM CQ - DOSED IN MG/24 HOURS) 21 mg/24hr patch Place 1 patch (21 mg total) onto the skin daily. (Patient not taking:  Reported on 11/23/2020) 28 patch 1  . pantoprazole (PROTONIX) 40 MG tablet Take 1 tablet (40 mg total) by mouth daily. (Patient not taking: Reported on 11/23/2020) 30 tablet 1   No current facility-administered medications for this visit.    Allergies as of 11/23/2020  . (No Known Allergies)    Family History  Problem Relation Age of Onset  . Rectal cancer Father 87    Social History   Socioeconomic History  . Marital status: Widowed    Spouse name: Not on file  . Number of children: Not on file  . Years of education: Not on file  . Highest education level: Not on file  Occupational History  . Not on file  Tobacco Use  . Smoking status: Current Every Day Smoker    Packs/day: 1.50    Years: 40.00    Pack years: 60.00    Types: Cigarettes  . Smokeless tobacco: Never Used  . Tobacco comment: has had 2 cig in 2 weeks as of 08/17/2020  Vaping Use  . Vaping Use: Never used  Substance and Sexual Activity  . Alcohol use: Yes    Comment: Drinks 1 glass bourbon, has been doing that "forever"  . Drug use: Never  . Sexual activity: Not Currently  Other Topics Concern  . Not on file  Social History Narrative  . Not on file   Social Determinants of Health   Financial Resource Strain: Not on file  Food Insecurity: Not on file  Transportation Needs: Not on file  Physical Activity: Not on file  Stress: Not on file  Social Connections: Not on file    Subjective: Review of Systems  Constitutional: Negative for chills, fever, malaise/fatigue and weight loss.  HENT: Negative for congestion and sore throat.   Respiratory: Positive for shortness of breath (occaional but significantly improved). Negative for cough.        Still on oxygen  Cardiovascular: Negative for chest pain and palpitations.  Gastrointestinal: Negative for abdominal pain, blood in stool, diarrhea, melena, nausea and vomiting.  Musculoskeletal: Negative for joint pain and myalgias.  Skin: Negative for rash.   Neurological: Negative for dizziness and weakness.  Endo/Heme/Allergies: Does not bruise/bleed easily.  Psychiatric/Behavioral: Negative for depression. The patient is not nervous/anxious.   All other systems reviewed and are negative.    Objective: BP 129/81   Pulse 90   Temp (!) 96.8 F (36 C) (Temporal)   Ht _0  (1.6 m)   Wt 209 lb 12.8 oz (95.2 kg)   BMI 37.16 kg/m  Physical Exam Vitals and nursing note reviewed.  Constitutional:      General: She is not in acute distress.    Appearance: Normal appearance. She is well-developed. She is obese. She is not ill-appearing, toxic-appearing or diaphoretic.  HENT:     Head: Normocephalic and atraumatic.     Nose: No congestion or rhinorrhea.  Eyes:     General: No scleral icterus. Cardiovascular:     Rate and Rhythm: Normal rate and regular  rhythm.     Heart sounds: Normal heart sounds.  Pulmonary:     Effort: Pulmonary effort is normal. No respiratory distress.     Breath sounds: Normal breath sounds.  Abdominal:     General: Bowel sounds are normal.     Palpations: Abdomen is soft. There is no hepatomegaly, splenomegaly or mass.     Tenderness: There is no abdominal tenderness. There is no guarding or rebound.     Hernia: No hernia is present.  Skin:    General: Skin is warm and dry.     Coloration: Skin is not jaundiced.     Findings: No rash.  Neurological:     General: No focal deficit present.     Mental Status: She is alert and oriented to person, place, and time.  Psychiatric:        Attention and Perception: Attention normal.        Mood and Affect: Mood normal.        Speech: Speech normal.        Behavior: Behavior normal.        Thought Content: Thought content normal.        Cognition and Memory: Cognition and memory normal.      Assessment:  Very pleasant 60 year old female who presents for postprocedure follow-up after EGD for dysphagia.  The patient was initially seen in the hospital for  complaints of dysphagia.  On her EGD noted appearance of portal hypertensive gastropathy with no known history of liver disease.  No red flag/warning signs or symptoms.  She notes that she is due for a colonoscopy currently and would like to have this scheduled today.  Dysphagia: Some residual symptoms but significantly improved after EGD with dilation.  She remains on PPI but she has run out and is asking for refill.  I will send this to her pharmacy.  Recommend continue daily Protonix.  We rediscussed chewing and swallowing precautions to prevent recurrent dysphagia.  Portal hypertensive gastropathy: Unusual incidental finding with no known history of liver disease.  Review of her local EMR chart and care everywhere does not reveal any recent abdominal imaging.  Her CBC does have a low/normal platelet count, most recently 155.  At this point I will recheck some labs including CBC, BMP, hepatic function panel.  Also check a complete abdominal ultrasound with liver elastography to gauge for signs of portal hypertension, cirrhosis, and degree of scarring.  Further recommendations will follow  Family history of colorectal cancer: She states her father died of rectal cancer in his 12s.  Her last colonoscopy was likely over 10 years ago.  She would like to have this scheduled at this time.  We will proceed with scheduling per her request.  Proceed with TCS on propofol/MAC with Dr. Abbey Chatters on propofol/MAC in near future: the risks, benefits, and alternatives have been discussed with the patient in detail. The patient states understanding and desires to proceed.  ASA III   Plan: 1. Colonoscopy as per above 2. CBC, BMP, HFP 3. Complete abdominal ultrasound with liver elastography 4. Refill Protonix for 40 mg once daily 5. Call for any worsening or severe symptoms 6. Follow-up in 3 months    Thank you for allowing [place] to participate in the care of [place], DNP, AGNP-C Adult &  Gerontological Nurse Practitioner [facility]   11/23/2020 2:05 PM   Disclaimer: This note was dictated with voice recognition software. Similar sounding words can inadvertently be  transcribed and may not be corrected upon review.

## 2020-11-23 NOTE — Telephone Encounter (Signed)
Called pt to reschedule TCS with Dr. Abbey Chatters. She is actually an ASA 3. She is now scheduled for 2/22 at 1:30pm. Aware will mail her new instructions with new covid test appt. She voiced understanding.

## 2020-11-24 ENCOUNTER — Encounter: Payer: Self-pay | Admitting: *Deleted

## 2020-12-01 ENCOUNTER — Ambulatory Visit (HOSPITAL_COMMUNITY): Payer: Medicare Other

## 2020-12-05 ENCOUNTER — Ambulatory Visit (HOSPITAL_COMMUNITY)
Admission: RE | Admit: 2020-12-05 | Discharge: 2020-12-05 | Disposition: A | Discharge status: Home / Self Care | Payer: Medicare Other | Source: Ambulatory Visit | Attending: Nurse Practitioner | Admitting: Nurse Practitioner

## 2020-12-05 ENCOUNTER — Other Ambulatory Visit: Payer: Self-pay

## 2020-12-05 ENCOUNTER — Other Ambulatory Visit (HOSPITAL_COMMUNITY)
Admission: RE | Admit: 2020-12-05 | Discharge: 2020-12-05 | Disposition: A | Discharge status: Home / Self Care | Payer: Medicare Other | Source: Ambulatory Visit | Attending: Nurse Practitioner | Admitting: Nurse Practitioner

## 2020-12-05 DIAGNOSIS — R1319 Other dysphagia: Secondary | ICD-10-CM | POA: Diagnosis not present

## 2020-12-05 DIAGNOSIS — K766 Portal hypertension: Secondary | ICD-10-CM | POA: Insufficient documentation

## 2020-12-05 DIAGNOSIS — K3189 Other diseases of stomach and duodenum: Secondary | ICD-10-CM | POA: Diagnosis present

## 2020-12-05 LAB — HEPATIC FUNCTION PANEL
ALT: 16 U/L (ref 0–44)
AST: 12 U/L — ABNORMAL LOW (ref 15–41)
Albumin: 3.4 g/dL — ABNORMAL LOW (ref 3.5–5.0)
Alkaline Phosphatase: 78 U/L (ref 38–126)
Bilirubin, Direct: 0.2 mg/dL (ref 0.0–0.2)
Indirect Bilirubin: 0.5 mg/dL (ref 0.3–0.9)
Total Bilirubin: 0.7 mg/dL (ref 0.3–1.2)
Total Protein: 6.3 g/dL — ABNORMAL LOW (ref 6.5–8.1)

## 2020-12-05 LAB — BASIC METABOLIC PANEL
Anion gap: 8 (ref 5–15)
BUN: 15 mg/dL (ref 6–20)
CO2: 28 mmol/L (ref 22–32)
Calcium: 8.7 mg/dL — ABNORMAL LOW (ref 8.9–10.3)
Chloride: 103 mmol/L (ref 98–111)
Creatinine, Ser: 0.73 mg/dL (ref 0.44–1.00)
GFR, Estimated: >60 mL/min (ref >60)
Glucose, Bld: 129 mg/dL — ABNORMAL HIGH (ref 70–99)
Potassium: 3.4 mmol/L — ABNORMAL LOW (ref 3.5–5.1)
Sodium: 139 mmol/L (ref 135–145)

## 2020-12-06 ENCOUNTER — Ambulatory Visit (HOSPITAL_COMMUNITY): Admission: RE | Admit: 2020-12-06 | Payer: Medicare Other | Source: Ambulatory Visit

## 2020-12-11 ENCOUNTER — Telehealth: Payer: Self-pay | Admitting: Internal Medicine

## 2020-12-11 NOTE — Telephone Encounter (Signed)
Patient needs to reschedule her procedure [phone]

## 2020-12-12 ENCOUNTER — Telehealth: Payer: Self-pay | Admitting: Internal Medicine

## 2020-12-12 NOTE — Telephone Encounter (Signed)
[phone] PLEASE CALL PATIENT, SHE NEEDS TO RESCHEDULE HER PROCEDURE

## 2020-12-12 NOTE — Telephone Encounter (Signed)
Tried to call pt, no answer, LMOAM for return call.

## 2020-12-12 NOTE — Telephone Encounter (Signed)
Patient called back. She has been rescheduled to 3/15 am appt. Aware will need pre-op/covid test appt prior. Advised will mail with prep instructions.

## 2020-12-22 ENCOUNTER — Other Ambulatory Visit (HOSPITAL_COMMUNITY): Payer: Medicare Other

## 2020-12-28 ENCOUNTER — Ambulatory Visit (HOSPITAL_BASED_OUTPATIENT_CLINIC_OR_DEPARTMENT_OTHER): Payer: Medicare Other | Attending: Pulmonary Disease | Admitting: Pulmonary Disease

## 2020-12-28 ENCOUNTER — Other Ambulatory Visit: Payer: Self-pay

## 2020-12-28 DIAGNOSIS — R0902 Hypoxemia: Secondary | ICD-10-CM | POA: Insufficient documentation

## 2020-12-28 DIAGNOSIS — R0683 Snoring: Secondary | ICD-10-CM | POA: Diagnosis not present

## 2020-12-29 ENCOUNTER — Other Ambulatory Visit (HOSPITAL_COMMUNITY): Payer: Medicare Other

## 2021-01-01 ENCOUNTER — Ambulatory Visit (HOSPITAL_COMMUNITY)
Admission: RE | Admit: 2021-01-01 | Discharge: 2021-01-01 | Disposition: A | Discharge status: Home / Self Care | Payer: Medicare Other | Source: Ambulatory Visit | Attending: Acute Care | Admitting: Acute Care

## 2021-01-01 ENCOUNTER — Encounter: Payer: Self-pay | Admitting: Acute Care

## 2021-01-01 ENCOUNTER — Other Ambulatory Visit: Payer: Self-pay

## 2021-01-01 ENCOUNTER — Ambulatory Visit (INDEPENDENT_AMBULATORY_CARE_PROVIDER_SITE_OTHER): Payer: Medicare Other | Admitting: Acute Care

## 2021-01-01 DIAGNOSIS — Z87891 Personal history of nicotine dependence: Secondary | ICD-10-CM | POA: Diagnosis present

## 2021-01-01 DIAGNOSIS — Z122 Encounter for screening for malignant neoplasm of respiratory organs: Secondary | ICD-10-CM | POA: Diagnosis not present

## 2021-01-01 DIAGNOSIS — F17210 Nicotine dependence, cigarettes, uncomplicated: Secondary | ICD-10-CM | POA: Insufficient documentation

## 2021-01-01 NOTE — Progress Notes (Signed)
Virtual Visit via Telephone Note  I connected with Lydia Alexander on 01/01/21 at  2:00 PM EST by telephone and verified that I am speaking with the correct person using two identifiers.  Location: Patient: At home Provider: [place], [place], [place], [address]   I discussed the limitations, risks, security and privacy concerns of performing an evaluation and management service by telephone and the availability of in person appointments. I also discussed with the patient that there may be a patient responsible charge related to this service. The patient expressed understanding and agreed to proceed.    Shared Decision Making Visit Lung Cancer Screening Program [phone])   Eligibility:  Age 60 y.o.  Pack Years Smoking History Calculation 42 pack year smoking history (# packs/per year x # years smoked)  Recent History of coughing up blood  no  Unexplained weight loss? no ( >Than 15 pounds within the last 6 months )  Prior History Lung / other cancer no (Diagnosis within the last 5 years already requiring surveillance chest CT Scans).  Smoking Status Current Smoker  Former Smokers: Years since quit: NA  Quit Date: NA  Visit Components:  Discussion included one or more decision making aids. yes  Discussion included risk/benefits of screening. yes  Discussion included potential follow up diagnostic testing for abnormal scans. yes  Discussion included meaning and risk of over diagnosis. yes  Discussion included meaning and risk of False Positives. yes  Discussion included meaning of total radiation exposure. yes  Counseling Included:  Importance of adherence to annual lung cancer LDCT screening. yes  Impact of comorbidities on ability to participate in the program. yes  Ability and willingness to under diagnostic treatment. yes  Smoking Cessation Counseling:  Current Smokers:   Discussed importance of smoking cessation. yes  Information about tobacco  cessation classes and interventions provided to patient. yes  Patient provided with "ticket" for LDCT Scan. yes  Symptomatic Patient. no  Counseling  Diagnosis Code: Tobacco Use Z72.0  Asymptomatic Patient yes  Counseling (Intermediate counseling: > three minutes counseling) E5277  Former Smokers:   Discussed the importance of maintaining cigarette abstinence. yes  Diagnosis Code: Personal History of Nicotine Dependence. O24.235  Information about tobacco cessation classes and interventions provided to patient. Yes  Patient provided with "ticket" for LDCT Scan. yes  Written Order for Lung Cancer Screening with LDCT placed in Epic. Yes (CT Chest Lung Cancer Screening Low Dose W/O CM) [id number] Z12.2-Screening of respiratory organs Z87.891-Personal history of nicotine dependence    I have spent 25 minutes of face to face time with Lydia Alexander discussing the risks and benefits of lung cancer screening. We viewed a power point together that explained in detail the above noted topics. We paused at intervals to allow for questions to be asked and answered to ensure understanding.We discussed that the single most powerful action that she can take to decrease her risk of developing lung cancer is to quit smoking. We discussed whether or not she is ready to commit to setting a quit date. We discussed options for tools to aid in quitting smoking including nicotine replacement therapy, non-nicotine medications, support groups, Quit Smart classes, and behavior modification. We discussed that often times setting smaller, more achievable goals, such as eliminating 1 cigarette a day for a week and then 2 cigarettes a day for a week can be helpful in slowly decreasing the number of cigarettes smoked. This allows for a sense of accomplishment as well as providing a clinical  benefit. I gave her the " Be Stronger Than Your Excuses" card with contact information for community resources, classes, free  nicotine replacement therapy, and access to mobile apps, text messaging, and on-line smoking cessation help. I have also given her my card and contact information in the event she needs to contact me. We discussed the time and location of the scan, and that either Doroteo Glassman RN or I will call with the results within 24-48 hours of receiving them. I have offered her  a copy of the power point we viewed  as a resource in the event they need reinforcement of the concepts we discussed today in the office. The patient verbalized understanding of all of  the above and had no further questions upon leaving the office. They have my contact information in the event they have any further questions.  I spent 3 minutes counseling on smoking cessation and the health risks of continued tobacco abuse.  I explained to the patient that there has been a high incidence of coronary artery disease noted on these exams. I explained that this is a non-gated exam therefore degree or severity cannot be determined. This patient is on statin therapy. I have asked the patient to follow-up with their PCP regarding any incidental finding of coronary artery disease and management with diet or medication as their PCP  feels is clinically indicated. The patient verbalized understanding of the above and had no further questions upon completion of the visit.      Magdalen Spatz, NP 01/01/2021 2:30 PM

## 2021-01-01 NOTE — Patient Instructions (Signed)
Thank you for participating in the [facility]. It was our pleasure to meet you today. We will call you with the results of your scan within the next few days. Your scan will be assigned a Lung RADS category score by the physicians reading the scans.  This Lung RADS score determines follow up scanning.  See below for description of categories, and follow up screening recommendations. We will be in touch to schedule your follow up screening annually or based on recommendations of our providers. We will fax a copy of your scan results to your Primary Care Physician, or the physician who referred you to the program, to ensure they have the results. Please call the office if you have any questions or concerns regarding your scanning experience or results.  Our office number is [phone]. Please speak with Denise Phelps, RN. She is our Lung Cancer Screening RN. If she is unavailable when you call, please have the office staff send her a message. She will return your call at her earliest convenience. Remember, if your scan is normal, we will scan you annually as long as you continue to meet the criteria for the program. (Age 55-77, Current smoker or smoker who has quit within the last 15 years). If you are a smoker, remember, quitting is the single most powerful action that you can take to decrease your risk of lung cancer and other pulmonary, breathing related problems. We know quitting is hard, and we are here to help.  Please let us know if there is anything we can do to help you meet your goal of quitting. If you are a former smoker, congratulations. We are proud of you! Remain smoke free! Remember you can refer friends or family members through the number above.  We will screen them to make sure they meet criteria for the program. Thank you for helping us take better care of you by participating in Lung Screening.  Lung RADS Categories:  Lung RADS 1: no nodules  or definitely non-concerning nodules.  Recommendation is for a repeat annual scan in 12 months.  Lung RADS 2:  nodules that are non-concerning in appearance and behavior with a very low likelihood of becoming an active cancer. Recommendation is for a repeat annual scan in 12 months.  Lung RADS 3: nodules that are probably non-concerning , includes nodules with a low likelihood of becoming an active cancer.  Recommendation is for a 6-month repeat screening scan. Often noted after an upper respiratory illness. We will be in touch to make sure you have no questions, and to schedule your 6-month scan.  Lung RADS 4 A: nodules with concerning findings, recommendation is most often for a follow up scan in 3 months or additional testing based on our provider's assessment of the scan. We will be in touch to make sure you have no questions and to schedule the recommended 3 month follow up scan.  Lung RADS 4 B:  indicates findings that are concerning. We will be in touch with you to schedule additional diagnostic testing based on our provider's  assessment of the scan.   

## 2021-01-02 ENCOUNTER — Telehealth: Payer: Self-pay | Admitting: Acute Care

## 2021-01-02 DIAGNOSIS — F17210 Nicotine dependence, cigarettes, uncomplicated: Secondary | ICD-10-CM

## 2021-01-02 DIAGNOSIS — R0683 Snoring: Secondary | ICD-10-CM

## 2021-01-02 DIAGNOSIS — Z87891 Personal history of nicotine dependence: Secondary | ICD-10-CM

## 2021-01-02 NOTE — Telephone Encounter (Signed)
Call returned to stacy, confirmed DOB. Ct results as noted below:  IMPRESSION: 1. Lung-RADS 4A, suspicious. Dominant indistinct 9.0 mm lingular pulmonary nodule, which appears somewhat bandlike, potentially representing nodular scarring. Follow up low-dose chest CT without contrast in 3 months (please use the following order, "CT CHEST LCS NODULE FOLLOW-UP W/O CM") is recommended. 2. One vessel coronary atherosclerosis. 3. Diffuse hepatic steatosis. 4. Aortic Atherosclerosis (ICD10-I70.0) and Emphysema (ICD10-J43.9).  Will route information to SG.

## 2021-01-02 NOTE — Procedures (Signed)
    Patient Name: Lydia Alexander, Lamison Date: 12/28/2020 Gender: Female D.O.B: 06-05-1961 Age (years): 59 Referring Provider: Freda [place] Height (inches): 59 Interpreting Physician: Chesley Mires MD, ABSM Weight (lbs): 212 RPSGT: Jorge Ny BMI: 49 MRN: [id number] Neck Size: 17.00  CLINICAL INFORMATION Sleep Study Type: NPSG  Indication for sleep study: COPD, Obesity, Snoring.  She uses 2 liters oxygen at night.  Epworth Sleepiness Score: 21  SLEEP STUDY TECHNIQUE As per the AASM Manual for the Scoring of Sleep and Associated Events v2.3 (April 2016) with a hypopnea requiring 4% desaturations.  The channels recorded and monitored were frontal, central and occipital EEG, electrooculogram (EOG), submentalis EMG (chin), nasal and oral airflow, thoracic and abdominal wall motion, anterior tibialis EMG, snore microphone, electrocardiogram, and pulse oximetry.  MEDICATIONS Medications self-administered by patient taken the night of the study : ALPRAZOLAM, CYANOCOBALAMIN, CYCLOBENZAPRINE HCL, GABAPENTIN, PANTOPRAZOLE SODIUM  SLEEP ARCHITECTURE The study was initiated at 10:40:50 PM and ended at 5:10:32 AM.  Sleep onset time was 7.3 minutes and the sleep efficiency was 69.5%. The total sleep time was 270.9 minutes.  Stage REM latency was 151.5 minutes.  The patient spent 14.34% of the night in stage N1 sleep, 76.80% in stage N2 sleep, 0.00% in stage N3 and 8.9% in REM.  Alpha intrusion was absent.  Supine sleep was 24.31%.  RESPIRATORY PARAMETERS The overall apnea/hypopnea index (AHI) was 1.1 per hour. There were 0 total apneas, including 0 obstructive, 0 central and 0 mixed apneas. There were 5 hypopneas and 4 RERAs.  The AHI during Stage REM sleep was 7.5 per hour.  AHI while supine was 0.9 per hour.  The mean oxygen saturation was 89.37%. The minimum SpO2 during sleep was 61.00%.  moderate snoring was noted during this study.  CARDIAC DATA The 2 lead EKG  demonstrated sinus rhythm. The mean heart rate was 88.18 beats per minute. Other EKG findings include: PVCs.  LEG MOVEMENT DATA The total PLMS were 73 with a resulting PLMS index of 16.17. Associated arousal with leg movement index was 0.0 .  IMPRESSIONS - No significant obstructive sleep apnea occurred during this study (AHI = 1.1/h). - No significant central sleep apnea occurred during this study (CAI = 0.0/h). - Severe oxygen desaturation was noted during this study (Min O2 = 61.00%). - She used 1 liter supplemental oxygen during the study with good control of her oxygen level. - The patient snored with moderate snoring volume.  DIAGNOSIS - Nocturnal Hypoxemia (G47.36)  RECOMMENDATIONS - Continue supplemental oxygen at night. - Avoid alcohol, sedatives and other CNS depressants that may worsen sleep apnea and disrupt normal sleep architecture. - Sleep hygiene should be reviewed to assess factors that may improve sleep quality. - Weight management and regular exercise should be initiated or continued if appropriate.  [Electronically signed] 01/02/2021 01:59 PM  Chesley Mires MD, [place], American Board of Sleep Medicine   NPI: [id number]

## 2021-01-03 ENCOUNTER — Telehealth: Payer: Self-pay

## 2021-01-03 NOTE — Progress Notes (Signed)
I called Ms. Fasching to review her Low Dose CT results. There was no answer. I have left a message with contact information on her voicemail [phone]) and I have asked her to call the office and ask for either [facility] or myself so we can review the scan results with her.   Denise, 4A, 3 month follow up. We will send results to PCP once we have spoken with the patient.  Thanks so much

## 2021-01-03 NOTE — Telephone Encounter (Signed)
-----   Message from Freddi Starr, MD sent at 01/03/2021  9:47 AM EST ----- Regarding: Sleep Study results Please let patient she does not have obstructive sleep apnea. She does have oxygen desaturations at night which respond to supplemental oxygen, so she should continue oxygen 1-2L at night time.   Thanks, Jon  ----- Message ----- From: Chesley Mires, MD Sent: 01/02/2021   2:00 PM EST To: Freddi Starr, MD

## 2021-01-03 NOTE — Telephone Encounter (Signed)
Left message for patient to call back for results.  

## 2021-01-03 NOTE — Telephone Encounter (Signed)
This will be called through the screening program. Thanks so much.

## 2021-01-04 NOTE — Progress Notes (Signed)
I have called the patient again. There was no answer at either the home or mobile phone. I have left HIPPA compliant messages on both phones requesting the patient call the office for the results of her LDCT with our office contact information.

## 2021-01-08 NOTE — Telephone Encounter (Signed)
Spoke with pt and advised of CT results per Eric Form, NP. Pt verbalized understanding. Order placed for 3 mth f/u nodule CT. Pt is aware we will contact her closer to that time to get CT scheduled. Copy of Ct faxed to PCP. Nothing further needed at this time.

## 2021-01-10 NOTE — Telephone Encounter (Signed)
Spoke with the pt and notified of results/recs per Dr Erin Fulling. She verbalized understanding. Nothing further needed.

## 2021-01-10 NOTE — Telephone Encounter (Signed)
Patient is returning phone call. Patient phone number is [phone] h or [phone] c.

## 2021-01-18 NOTE — Patient Instructions (Signed)
Lydia Alexander  01/18/2021     @PREFPERIOPPHARMACY @   Your procedure is scheduled on  01/23/2021.   Report to [facility] at  [phone]  A.M.   Call this number if you have problems the morning of surgery:  [phone]   Remember:  Follow the diet and prep instructions given to you by the office.                        Take these medicines the morning of surgery with A SIP OF WATER  Xanax ( if needed), flexeril, gabapentin, protonix.  Use your inhalers before you come and bring your rescue inhaler with you.                 Please brush your teeth.  Do not wear jewelry, make-up or nail polish.  Do not wear lotions, powders, or perfumes, or deodorant.  Do not shave 48 hours prior to surgery.  Men may shave face and neck.  Do not bring valuables to the hospital.  [facility] is not responsible for any belongings or valuables.  Contacts, dentures or bridgework may not be worn into surgery.  Leave your suitcase in the car.  After surgery it may be brought to your room.  For patients admitted to the hospital, discharge time will be determined by your treatment team.  Patients discharged the day of surgery will not be allowed to drive home and must have someone with them for 24 hours.     Special instructions:  DO NOT smoke tobacco or vape the morning of your procedure.    Please read over the following fact sheets that you were given. Anesthesia Post-op Instructions and Care and Recovery After Surgery       Colonoscopy, Adult, Care After This sheet gives you information about how to care for yourself after your procedure. Your health care provider may also give you more specific instructions. If you have problems or questions, contact your health care provider. What can I expect after the procedure? After the procedure, it is common to have:  A small amount of blood in your stool for 24 hours after the procedure.  Some gas.  Mild cramping or bloating of your  abdomen. Follow these instructions at home: Eating and drinking  Drink enough fluid to keep your urine pale yellow.  Follow instructions from your health care provider about eating or drinking restrictions.  Resume your normal diet as instructed by your health care provider. Avoid heavy or fried foods that are hard to digest.   Activity  Rest as told by your health care provider.  Avoid sitting for a long time without moving. Get up to take short walks every 1-2 hours. This is important to improve blood flow and breathing. Ask for help if you feel weak or unsteady.  Return to your normal activities as told by your health care provider. Ask your health care provider what activities are safe for you. Managing cramping and bloating  Try walking around when you have cramps or feel bloated.  Apply heat to your abdomen as told by your health care provider. Use the heat source that your health care provider recommends, such as a moist heat pack or a heating pad. ? Place a towel between your skin and the heat source. ? Leave the heat on for 20-30 minutes. ? Remove the heat if your skin turns bright red. This is especially important  if you are unable to feel pain, heat, or cold. You may have a greater risk of getting burned.   General instructions  If you were given a sedative during the procedure, it can affect you for several hours. Do not drive or operate machinery until your health care provider says that it is safe.  For the first 24 hours after the procedure: ? Do not sign important documents. ? Do not drink alcohol. ? Do your regular daily activities at a slower pace than normal. ? Eat soft foods that are easy to digest.  Take over-the-counter and prescription medicines only as told by your health care provider.  Keep all follow-up visits as told by your health care provider. This is important. Contact a health care provider if:  You have blood in your stool 2-3 days after the  procedure. Get help right away if you have:  More than a small spotting of blood in your stool.  Large blood clots in your stool.  Swelling of your abdomen.  Nausea or vomiting.  A fever.  Increasing pain in your abdomen that is not relieved with medicine. Summary  After the procedure, it is common to have a small amount of blood in your stool. You may also have mild cramping and bloating of your abdomen.  If you were given a sedative during the procedure, it can affect you for several hours. Do not drive or operate machinery until your health care provider says that it is safe.  Get help right away if you have a lot of blood in your stool, nausea or vomiting, a fever, or increased pain in your abdomen. This information is not intended to replace advice given to you by your health care provider. Make sure you discuss any questions you have with your health care provider. Document Revised: 10/22/2019 Document Reviewed: 05/24/2019 Elsevier Patient Education  2021 [place] After This sheet gives you information about how to care for yourself after your procedure. Your health care provider may also give you more specific instructions. If you have problems or questions, contact your health care provider. What can I expect after the procedure? After the procedure, it is common to have:  Tiredness.  Forgetfulness about what happened after the procedure.  Impaired judgment for important decisions.  Nausea or vomiting.  Some difficulty with balance. Follow these instructions at home: For the time period you were told by your health care provider:  Rest as needed.  Do not participate in activities where you could fall or become injured.  Do not drive or use machinery.  Do not drink alcohol.  Do not take sleeping pills or medicines that cause drowsiness.  Do not make important decisions or sign legal documents.  Do not take care of  children on your own.      Eating and drinking  Follow the diet that is recommended by your health care provider.  Drink enough fluid to keep your urine pale yellow.  If you vomit: ? Drink water, juice, or soup when you can drink without vomiting. ? Make sure you have little or no nausea before eating solid foods. General instructions  Have a responsible adult stay with you for the time you are told. It is important to have someone help care for you until you are awake and alert.  Take over-the-counter and prescription medicines only as told by your health care provider.  If you have sleep apnea, surgery and certain medicines can increase  your risk for breathing problems. Follow instructions from your health care provider about wearing your sleep device: ? Anytime you are sleeping, including during daytime naps. ? While taking prescription pain medicines, sleeping medicines, or medicines that make you drowsy.  Avoid smoking.  Keep all follow-up visits as told by your health care provider. This is important. Contact a health care provider if:  You keep feeling nauseous or you keep vomiting.  You feel light-headed.  You are still sleepy or having trouble with balance after 24 hours.  You develop a rash.  You have a fever.  You have redness or swelling around the IV site. Get help right away if:  You have trouble breathing.  You have new-onset confusion at home. Summary  For several hours after your procedure, you may feel tired. You may also be forgetful and have poor judgment.  Have a responsible adult stay with you for the time you are told. It is important to have someone help care for you until you are awake and alert.  Rest as told. Do not drive or operate machinery. Do not drink alcohol or take sleeping pills.  Get help right away if you have trouble breathing, or if you suddenly become confused. This information is not intended to replace advice given to you  by your health care provider. Make sure you discuss any questions you have with your health care provider. Document Revised: 07/13/2020 Document Reviewed: 09/30/2019 Elsevier Patient Education  2021 [employer].

## 2021-01-19 ENCOUNTER — Other Ambulatory Visit: Payer: Self-pay

## 2021-01-19 ENCOUNTER — Encounter (HOSPITAL_COMMUNITY)
Admission: RE | Admit: 2021-01-19 | Discharge: 2021-01-19 | Disposition: A | Discharge status: Home / Self Care | Payer: Medicare Other | Source: Ambulatory Visit | Attending: Internal Medicine | Admitting: Internal Medicine

## 2021-01-19 ENCOUNTER — Other Ambulatory Visit (HOSPITAL_COMMUNITY)
Admission: RE | Admit: 2021-01-19 | Discharge: 2021-01-19 | Disposition: A | Discharge status: Home / Self Care | Payer: Medicare Other | Source: Ambulatory Visit | Attending: Internal Medicine | Admitting: Internal Medicine

## 2021-01-19 DIAGNOSIS — Z20822 Contact with and (suspected) exposure to covid-19: Secondary | ICD-10-CM | POA: Insufficient documentation

## 2021-01-19 DIAGNOSIS — Z01812 Encounter for preprocedural laboratory examination: Secondary | ICD-10-CM | POA: Diagnosis not present

## 2021-01-20 LAB — SARS CORONAVIRUS 2 (TAT 6-24 HRS): SARS Coronavirus 2: NEGATIVE

## 2021-01-23 ENCOUNTER — Ambulatory Visit (HOSPITAL_COMMUNITY): Payer: Medicare Other | Admitting: Anesthesiology

## 2021-01-23 ENCOUNTER — Other Ambulatory Visit: Payer: Self-pay

## 2021-01-23 ENCOUNTER — Ambulatory Visit (HOSPITAL_COMMUNITY)
Admission: RE | Admit: 2021-01-23 | Discharge: 2021-01-23 | Disposition: A | Discharge status: Home / Self Care | Payer: Medicare Other | Attending: Internal Medicine | Admitting: Internal Medicine

## 2021-01-23 ENCOUNTER — Encounter (HOSPITAL_COMMUNITY): Payer: Self-pay

## 2021-01-23 ENCOUNTER — Encounter (HOSPITAL_COMMUNITY)
Admission: RE | Disposition: A | Discharge status: Home / Self Care | Payer: Self-pay | Source: Home / Self Care | Attending: Internal Medicine

## 2021-01-23 DIAGNOSIS — D122 Benign neoplasm of ascending colon: Secondary | ICD-10-CM | POA: Insufficient documentation

## 2021-01-23 DIAGNOSIS — K5730 Diverticulosis of large intestine without perforation or abscess without bleeding: Secondary | ICD-10-CM | POA: Insufficient documentation

## 2021-01-23 DIAGNOSIS — D125 Benign neoplasm of sigmoid colon: Secondary | ICD-10-CM | POA: Diagnosis not present

## 2021-01-23 DIAGNOSIS — Z9981 Dependence on supplemental oxygen: Secondary | ICD-10-CM | POA: Insufficient documentation

## 2021-01-23 DIAGNOSIS — Z800 Family history of malignant neoplasm of digestive organs: Secondary | ICD-10-CM | POA: Diagnosis not present

## 2021-01-23 DIAGNOSIS — K635 Polyp of colon: Secondary | ICD-10-CM | POA: Diagnosis not present

## 2021-01-23 DIAGNOSIS — Z1211 Encounter for screening for malignant neoplasm of colon: Secondary | ICD-10-CM | POA: Insufficient documentation

## 2021-01-23 DIAGNOSIS — Z79899 Other long term (current) drug therapy: Secondary | ICD-10-CM | POA: Diagnosis not present

## 2021-01-23 DIAGNOSIS — D123 Benign neoplasm of transverse colon: Secondary | ICD-10-CM | POA: Diagnosis not present

## 2021-01-23 DIAGNOSIS — F17210 Nicotine dependence, cigarettes, uncomplicated: Secondary | ICD-10-CM | POA: Diagnosis not present

## 2021-01-23 DIAGNOSIS — K648 Other hemorrhoids: Secondary | ICD-10-CM | POA: Diagnosis not present

## 2021-01-23 HISTORY — PX: POLYPECTOMY: SHX5525

## 2021-01-23 HISTORY — PX: COLONOSCOPY WITH PROPOFOL: SHX5780

## 2021-01-23 SURGERY — COLONOSCOPY WITH PROPOFOL
Anesthesia: General

## 2021-01-23 MED ORDER — PROPOFOL 500 MG/50ML IV EMUL
INTRAVENOUS | Status: DC | PRN
  Administered 2021-01-23: 100 ug/kg/min via INTRAVENOUS

## 2021-01-23 MED ORDER — PROPOFOL 10 MG/ML IV BOLUS
INTRAVENOUS | Status: DC | PRN
  Administered 2021-01-23: 70 mg via INTRAVENOUS
  Administered 2021-01-23 (×3): 10 mg via INTRAVENOUS
  Administered 2021-01-23: 20 mg via INTRAVENOUS

## 2021-01-23 MED ORDER — IPRATROPIUM-ALBUTEROL 0.5-2.5 (3) MG/3ML IN SOLN
3.0000 mL | Freq: Once | RESPIRATORY_TRACT | Status: AC
  Administered 2021-01-23: 3 mL via RESPIRATORY_TRACT

## 2021-01-23 MED ORDER — LIDOCAINE HCL (CARDIAC) PF 100 MG/5ML IV SOSY
PREFILLED_SYRINGE | INTRAVENOUS | Status: DC | PRN
  Administered 2021-01-23: 50 mg via INTRAVENOUS

## 2021-01-23 MED ORDER — LACTATED RINGERS IV SOLN: INTRAVENOUS | Status: DC

## 2021-01-23 MED ORDER — IPRATROPIUM-ALBUTEROL 0.5-2.5 (3) MG/3ML IN SOLN
RESPIRATORY_TRACT | Status: AC
  Filled 2021-01-23: qty 3

## 2021-01-23 NOTE — Transfer of Care (Signed)
Immediate Anesthesia Transfer of Care Note  Patient: Marlo Claxton  Procedure(s) Performed: COLONOSCOPY WITH PROPOFOL (N/A ) POLYPECTOMY  Patient Location: PACU  Anesthesia Type:General  Level of Consciousness: awake, alert  and oriented  Airway & Oxygen Therapy: Patient Spontanous Breathing and Patient connected to nasal cannula oxygen  Post-op Assessment: Report given to RN and Post -op Vital signs reviewed and stable  Post vital signs: Reviewed and stable  Last Vitals:  Vitals Value Taken Time  BP    Temp    Pulse 80 01/23/21 0823  Resp 15 01/23/21 0823  SpO2 98 % 01/23/21 0823  Vitals shown include unvalidated device data.  Last Pain:  Vitals:   01/23/21 0758  TempSrc:   PainSc: 0-No pain         Complications: No complications documented.

## 2021-01-23 NOTE — Anesthesia Postprocedure Evaluation (Signed)
Anesthesia Post Note  Patient: International aid/development worker  Procedure(s) Performed: COLONOSCOPY WITH PROPOFOL (N/A ) POLYPECTOMY  Patient location during evaluation: Phase II Anesthesia Type: General Level of consciousness: awake and alert Pain management: satisfactory to patient Vital Signs Assessment: post-procedure vital signs reviewed and stable Respiratory status: spontaneous breathing and respiratory function stable Cardiovascular status: blood pressure returned to baseline and stable Postop Assessment: no apparent nausea or vomiting and adequate PO intake Anesthetic complications: no   No complications documented.   Last Vitals:  Vitals:   01/23/21 0830 01/23/21 0844  BP: (!) 97/47 121/65  Pulse: 78 76  Resp: 18 20  Temp:  36.6 C  SpO2: 98% 94%    Last Pain:  Vitals:   01/23/21 0844  TempSrc: Oral  PainSc: 0-No pain                 Karna Dupes

## 2021-01-23 NOTE — Op Note (Signed)
[facility] Patient Name: Lydia Alexander Procedure Date: 01/23/2021 7:57 AM MRN: [id number] Date of Birth: [birth year] Attending MD: Elon Alas. Abbey Chatters DO CSN: [id number] Age: 60 Admit Type: Outpatient Procedure:                Colonoscopy Indications:              Screening for colorectal malignant neoplasm Providers:                Elon Alas. Abbey Chatters, DO, Janeece Riggers, RN, Nelma Rothman,                            Technician Referring MD:              Medicines:                See the Anesthesia note for documentation of the                            administered medications Complications:            No immediate complications. Estimated Blood Loss:     Estimated blood loss was minimal. Procedure:                Pre-Anesthesia Assessment:                           - The anesthesia plan was to use monitored                            anesthesia care (MAC).                           After obtaining informed consent, the colonoscope                            was passed under direct vision. Throughout the                            procedure, the patient's blood pressure, pulse, and                            oxygen saturations were monitored continuously. The                            [id number]-H190DL ([id number]) scope was introduced through                            the anus and advanced to the the cecum, identified                            by appendiceal orifice and ileocecal valve. The                            colonoscopy was performed without difficulty. The                            patient tolerated the procedure well. The  quality                            of the bowel preparation was evaluated using the                            BBPS [facility] Bowel Preparation Scale) with scores                            of: Right Colon = 2 (minor amount of residual                            staining, small fragments of stool and/or opaque                            liquid, but mucosa seen  well), Transverse Colon = 3                            (entire mucosa seen well with no residual staining,                            small fragments of stool or opaque liquid) and Left                            Colon = 3 (entire mucosa seen well with no residual                            staining, small fragments of stool or opaque                            liquid). The total BBPS score equals 8. The quality                            of the bowel preparation was good. Scope In: 8:03:46 AM Scope Out: 8:17:48 AM Scope Withdrawal Time: 0 hours 10 minutes 37 seconds  Total Procedure Duration: 0 hours 14 minutes 2 seconds  Findings:      The perianal and digital rectal examinations were normal.      Non-bleeding internal hemorrhoids were found during endoscopy.      Multiple small and large-mouthed diverticula were found in the sigmoid       colon and descending colon.      A 3 mm polyp was found in the ascending colon. The polyp was sessile.       The polyp was removed with a cold snare. Resection and retrieval were       complete.      Two sessile polyps were found in the transverse colon. The polyps were 4       to 7 mm in size. These polyps were removed with a cold snare. Resection       and retrieval were complete.      The exam was otherwise without abnormality. Impression:               - Non-bleeding internal hemorrhoids.                           -  Diverticulosis in the sigmoid colon and in the                            descending colon.                           - One 3 mm polyp in the ascending colon, removed                            with a cold snare. Resected and retrieved.                           - Two 4 to 7 mm polyps in the transverse colon,                            removed with a cold snare. Resected and retrieved.                           - The examination was otherwise normal. Moderate Sedation:      Per Anesthesia Care Recommendation:           - Patient  has a contact number available for                            emergencies. The signs and symptoms of potential                            delayed complications were discussed with the                            patient. Return to normal activities tomorrow.                            Written discharge instructions were provided to the                            patient.                           - Resume previous diet.                           - Continue present medications.                           - Await pathology results.                           - No repeat colonoscopy due to age and                            comorbidities.                           - Return to GI clinic as previously scheduled. Procedure Code(s):        --- Professional ---  45385, Colonoscopy, flexible; with removal of                            tumor(s), polyp(s), or other lesion(s) by snare                            technique Diagnosis Code(s):        --- Professional ---                           K63.5, Polyp of colon                           Z12.11, Encounter for screening for malignant                            neoplasm of colon                           K64.8, Other hemorrhoids                           K57.30, Diverticulosis of large intestine without                            perforation or abscess without bleeding CPT copyright 2019 American Medical Association. All rights reserved. The codes documented in this report are preliminary and upon coder review may  be revised to meet current compliance requirements. Elon Alas. Abbey Chatters, DO Alden Abbey Chatters, DO 01/23/2021 8:25:31 AM This report has been signed electronically. Number of Addenda: 0

## 2021-01-23 NOTE — Discharge Instructions (Signed)
  Colonoscopy Discharge Instructions  Read the instructions outlined below and refer to this sheet in the next few weeks. These discharge instructions provide you with general information on caring for yourself after you leave the hospital. Your doctor may also give you specific instructions. While your treatment has been planned according to the most current medical practices available, unavoidable complications occasionally occur.   ACTIVITY  You may resume your regular activity, but move at a slower pace for the next 24 hours.   Take frequent rest periods for the next 24 hours.   Walking will help get rid of the air and reduce the bloated feeling in your belly (abdomen).   No driving for 24 hours (because of the medicine (anesthesia) used during the test).    Do not sign any important legal documents or operate any machinery for 24 hours (because of the anesthesia used during the test).  NUTRITION  Drink plenty of fluids.   You may resume your normal diet as instructed by your doctor.   Begin with a light meal and progress to your normal diet. Heavy or fried foods are harder to digest and may make you feel sick to your stomach (nauseated).   Avoid alcoholic beverages for 24 hours or as instructed.  MEDICATIONS  You may resume your normal medications unless your doctor tells you otherwise.  WHAT YOU CAN EXPECT TODAY  Some feelings of bloating in the abdomen.   Passage of more gas than usual.   Spotting of blood in your stool or on the toilet paper.  IF YOU HAD POLYPS REMOVED DURING THE COLONOSCOPY:  No aspirin products for 7 days or as instructed.   No alcohol for 7 days or as instructed.   Eat a soft diet for the next 24 hours.  FINDING OUT THE RESULTS OF YOUR TEST Not all test results are available during your visit. If your test results are not back during the visit, make an appointment with your caregiver to find out the results. Do not assume everything is normal if  you have not heard from your caregiver or the medical facility. It is important for you to follow up on all of your test results.  SEEK IMMEDIATE MEDICAL ATTENTION IF:  You have more than a spotting of blood in your stool.   Your belly is swollen (abdominal distention).   You are nauseated or vomiting.   You have a temperature over 101.   You have abdominal pain or discomfort that is severe or gets worse throughout the day.   Your colonoscopy revealed 3 polyp(s) which I removed successfully. Await pathology results, my office will contact you. You also have diverticulosis and internal hemorrhoids. I would recommend increasing fiber in your diet or adding OTC Benefiber/Metamucil. Be sure to drink at least 4 to 6 glasses of water daily. Follow-up with GI in 3 months with [facility].   I hope you have a great rest of your week!  Elon Alas. Abbey Chatters, D.O. Gastroenterology and Hepatology [facility]

## 2021-01-23 NOTE — Anesthesia Preprocedure Evaluation (Addendum)
Anesthesia Evaluation  Patient identified by MRN, date of birth, ID band Patient awake    Reviewed: Allergy & Precautions, NPO status , Patient's Chart, lab work & pertinent test results  History of Anesthesia Complications Negative for: history of anesthetic complications  Airway Mallampati: II  TM Distance: >3 FB Neck ROM: Full    Dental  (+) Dental Advisory Given, Poor Dentition, Missing, Chipped   Pulmonary shortness of breath and with exertion, Oxygen sleep apnea neg sleep apnea, pneumonia, resolved, COPD (uses oxygen during night),  COPD inhaler and oxygen dependent, Current Smoker and Patient abstained from smoking.,    Pulmonary exam normal breath sounds clear to auscultation       Cardiovascular Exercise Tolerance: Good Normal cardiovascular exam Rhythm:Regular Rate:Normal  02-Aug-2020 09:40:42 [facility] ROUTINE RECORD Sinus rhythm Low voltage, precordial leads   Neuro/Psych PSYCHIATRIC DISORDERS Anxiety Depression negative neurological ROS  negative psych ROS   GI/Hepatic GERD  Medicated and Controlled,(+) neg Cirrhosis    substance abuse  alcohol use,   Endo/Other  negative endocrine ROS  Renal/GU negative Renal ROS     Musculoskeletal  (+) Arthritis  (chronic back pain), Chronic back pain   Abdominal   Peds  Hematology negative hematology ROS (+)   Anesthesia Other Findings   Reproductive/Obstetrics negative OB ROS                           Anesthesia Physical  Anesthesia Plan  ASA: IV  Anesthesia Plan: General   Post-op Pain Management:    Induction: Intravenous  PONV Risk Score and Plan: TIVA  Airway Management Planned: Nasal Cannula and Natural Airway  Additional Equipment:   Intra-op Plan:   Post-operative Plan:   Informed Consent: I have reviewed the patients History and Physical, chart, labs and discussed the procedure including the  risks, benefits and alternatives for the proposed anesthesia with the patient or authorized representative who has indicated his/her understanding and acceptance.     Dental advisory given  Plan Discussed with: CRNA and Surgeon  Anesthesia Plan Comments:        Anesthesia Quick Evaluation

## 2021-01-23 NOTE — H&P (Signed)
Primary Care Physician:  Bridget Hartshorn, NP Primary Gastroenterologist:  Dr. Abbey Chatters  Pre-Procedure History & Physical: HPI:  Lydia Alexander is a 60 y.o. female is here for a colonoscopy to be performed for colon cancer screening purposes and family history of rectal cancer in father. No melena or hematochezia.  No abdominal pain or unintentional weight loss.  No change in bowel habits.  Overall feels well from a GI standpoint.  Past Medical History:  Diagnosis Date  . Back pain   . Depression   . GERD (gastroesophageal reflux disease)     Past Surgical History:  Procedure Laterality Date  . BALLOON DILATION N/A 08/22/2020   Procedure: BALLOON DILATION;  Surgeon: Eloise Harman, DO;  Location: [facility];  Service: Endoscopy;  Laterality: N/A;  . BIOPSY  08/22/2020   Procedure: BIOPSY;  Surgeon: Eloise Harman, DO;  Location: [facility];  Service: Endoscopy;;  . CHOLECYSTECTOMY    . DILATION AND CURETTAGE OF UTERUS    . ESOPHAGOGASTRODUODENOSCOPY (EGD) WITH PROPOFOL N/A 08/22/2020   Procedure: ESOPHAGOGASTRODUODENOSCOPY (EGD) WITH PROPOFOL;  Surgeon: Eloise Harman, DO;  Location: [facility];  Service: Endoscopy;  Laterality: N/A;  10:30  . TUBAL LIGATION      Prior to Admission medications   Medication Sig Start Date End Date Taking? Authorizing Provider  acetaminophen (TYLENOL) 650 MG CR tablet Take 1,300 mg by mouth every 8 (eight) hours.   Yes [provider]  ALPRAZolam Duanne Moron) 1 MG tablet Take 1 mg by mouth 2 (two) times daily. 07/21/20  Yes [provider]  atorvastatin (LIPITOR) 10 MG tablet Take 10 mg by mouth at bedtime. 06/16/20 01/17/21 Yes [provider]  Cyanocobalamin (B-12) 2500 MCG TABS Take 2,500 mcg by mouth daily.   Yes [provider]  cyclobenzaprine (FLEXERIL) 10 MG tablet Take 10 mg by mouth 2 (two) times daily as needed for muscle spasms.  05/30/20  Yes [provider]  diphenhydrAMINE (BENADRYL)  25 MG tablet Take 25 mg by mouth at bedtime as needed for sleep.   Yes [provider]  gabapentin (NEURONTIN) 300 MG capsule Take 600 mg by mouth in the morning, at noon, and at bedtime.  06/05/17 01/23/21 Yes [provider]  pantoprazole (PROTONIX) 40 MG tablet Take 1 tablet (40 mg total) by mouth daily. 11/23/20  Yes Carlis Stable, NP  Vitamin D, Ergocalciferol, (DRISDOL) 1.25 MG (50000 UNIT) CAPS capsule Take 50,000 Units by mouth once a week. 06/22/20  Yes [provider]  albuterol (VENTOLIN HFA) 108 (90 Base) MCG/ACT inhaler Inhale 2 puffs into the lungs every 6 (six) hours as needed for wheezing or shortness of breath. 08/05/20   Shelly Coss, MD  naproxen (NAPROSYN) 500 MG tablet Take 1 tablet (500 mg total) by mouth daily as needed. Patient not taking: Reported on 01/17/2021 08/05/20   Shelly Coss, MD  nicotine (NICODERM CQ - DOSED IN MG/24 HOURS) 21 mg/24hr patch Place 1 patch (21 mg total) onto the skin daily. Patient not taking: Reported on 01/17/2021 08/06/20   Shelly Coss, MD  Tiotropium Bromide Monohydrate (SPIRIVA RESPIMAT) 2.5 MCG/ACT AERS Inhale 1 puff into the lungs daily. 11/15/20   Freddi Starr, MD    Allergies as of 11/23/2020  . (No Known Allergies)    Family History  Problem Relation Age of Onset  . Rectal cancer Father 13    Social History   Socioeconomic History  . Marital status: Widowed    Spouse  name: Not on file  . Number of children: Not on file  . Years of education: Not on file  . Highest education level: Not on file  Occupational History  . Not on file  Tobacco Use  . Smoking status: Current Every Day Smoker    Packs/day: 1.50    Years: 40.00    Pack years: 60.00    Types: Cigarettes  . Smokeless tobacco: Never Used  . Tobacco comment: smoking .75 pack daily-AH/01/01/2021  Vaping Use  . Vaping Use: Never used  Substance and Sexual Activity  . Alcohol use: Yes    Comment: Drinks 1 glass bourbon, has been doing  that "forever"  . Drug use: Never  . Sexual activity: Not Currently  Other Topics Concern  . Not on file  Social History Narrative  . Not on file   Social Determinants of Health   Financial Resource Strain: Not on file  Food Insecurity: Not on file  Transportation Needs: Not on file  Physical Activity: Not on file  Stress: Not on file  Social Connections: Not on file  Intimate Partner Violence: Not on file    Review of Systems: See HPI, otherwise negative ROS  Physical Exam: Vital signs in last 24 hours: Temp:  [97.6 F (36.4 C)] 97.6 F (36.4 C) (03/15 0716) Pulse Rate:  [85] 85 (03/15 0716) BP: (130)/(69) 130/69 (03/15 0716) SpO2:  [96 %] 96 % (03/15 0716)   General:   Alert,  Well-developed, well-nourished, pleasant and cooperative in NAD Head:  Normocephalic and atraumatic. Eyes:  Sclera clear, no icterus.   Conjunctiva pink. Ears:  Normal auditory acuity. Nose:  No deformity, discharge,  or lesions. Mouth:  No deformity or lesions, dentition normal. Neck:  Supple; no masses or thyromegaly. Lungs:  Clear throughout to auscultation.   No wheezes, crackles, or rhonchi. No acute distress. Heart:  Regular rate and rhythm; no murmurs, clicks, rubs,  or gallops. Abdomen:  Soft, nontender and nondistended. No masses, hepatosplenomegaly or hernias noted. Normal bowel sounds, without guarding, and without rebound.   Msk:  Symmetrical without gross deformities. Normal posture. Extremities:  Without clubbing or edema. Neurologic:  Alert and  oriented x4;  grossly normal neurologically. Skin:  Intact without significant lesions or rashes. Cervical Nodes:  No significant cervical adenopathy. Psych:  Alert and cooperative. Normal mood and affect.  Impression/Plan: Lydia Alexander is here for a colonoscopy to be performed for colon cancer screening purposes and family history of rectal cancer in father.  The risks of the procedure including infection, bleed, or perforation as  well as benefits, limitations, alternatives and imponderables have been reviewed with the patient. Questions have been answered. All parties agreeable.

## 2021-01-24 LAB — SURGICAL PATHOLOGY

## 2021-01-30 ENCOUNTER — Encounter (HOSPITAL_COMMUNITY): Payer: Self-pay | Admitting: Internal Medicine

## 2021-01-30 NOTE — Progress Notes (Signed)
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 We can discuss possible repeat colonoscopy in 5 years, depending upon your health at that time. Continue to follow-up with GI as previously scheduled. If you have any questions please call our office at [phone].     Thank you, Oleh Genin CMA

## 2021-02-08 ENCOUNTER — Other Ambulatory Visit: Payer: Self-pay

## 2021-02-08 ENCOUNTER — Emergency Department (HOSPITAL_COMMUNITY)
Admission: EM | Admit: 2021-02-08 | Discharge: 2021-02-09 | Disposition: A | Discharge status: Home / Self Care | Payer: Medicare Other | Attending: Emergency Medicine | Admitting: Emergency Medicine

## 2021-02-08 ENCOUNTER — Encounter (HOSPITAL_COMMUNITY): Payer: Self-pay | Admitting: Emergency Medicine

## 2021-02-08 DIAGNOSIS — M79661 Pain in right lower leg: Secondary | ICD-10-CM | POA: Diagnosis present

## 2021-02-08 DIAGNOSIS — F17210 Nicotine dependence, cigarettes, uncomplicated: Secondary | ICD-10-CM | POA: Diagnosis not present

## 2021-02-08 DIAGNOSIS — R600 Localized edema: Secondary | ICD-10-CM

## 2021-02-08 DIAGNOSIS — R609 Edema, unspecified: Secondary | ICD-10-CM | POA: Diagnosis not present

## 2021-02-08 NOTE — ED Triage Notes (Signed)
Pt c/o bilateral lower leg pain with discoloration; denies injury

## 2021-02-09 LAB — BASIC METABOLIC PANEL
Anion gap: 8 (ref 5–15)
BUN: 9 mg/dL (ref 6–20)
CO2: 27 mmol/L (ref 22–32)
Calcium: 8.5 mg/dL — ABNORMAL LOW (ref 8.9–10.3)
Chloride: 102 mmol/L (ref 98–111)
Creatinine, Ser: 0.82 mg/dL (ref 0.44–1.00)
GFR, Estimated: >60 mL/min (ref >60)
Glucose, Bld: 108 mg/dL — ABNORMAL HIGH (ref 70–99)
Potassium: 4 mmol/L (ref 3.5–5.1)
Sodium: 137 mmol/L (ref 135–145)

## 2021-02-09 LAB — CBC WITH DIFFERENTIAL/PLATELET
Abs Immature Granulocytes: 0.03 10*3/uL (ref 0.00–0.07)
Basophils Absolute: 0.1 10*3/uL (ref 0.0–0.1)
Basophils Relative: 1 %
Eosinophils Absolute: 0.1 10*3/uL (ref 0.0–0.5)
Eosinophils Relative: 1 %
HCT: 45.4 % (ref 36.0–46.0)
Hemoglobin: 14.3 g/dL (ref 12.0–15.0)
Immature Granulocytes: 0 %
Lymphocytes Relative: 27 %
Lymphs Abs: 2 10*3/uL (ref 0.7–4.0)
MCH: 33.3 pg (ref 26.0–34.0)
MCHC: 31.5 g/dL (ref 30.0–36.0)
MCV: 105.6 fL — ABNORMAL HIGH (ref 80.0–100.0)
Monocytes Absolute: 0.6 10*3/uL (ref 0.1–1.0)
Monocytes Relative: 8 %
Neutro Abs: 4.7 10*3/uL (ref 1.7–7.7)
Neutrophils Relative %: 63 %
Platelets: 203 10*3/uL (ref 150–400)
RBC: 4.3 MIL/uL (ref 3.87–5.11)
RDW: 12.9 % (ref 11.5–15.5)
WBC: 7.4 10*3/uL (ref 4.0–10.5)
nRBC: 0 % (ref 0.0–0.2)

## 2021-02-09 MED ORDER — FUROSEMIDE 20 MG PO TABS: 20.0000 mg | ORAL_TABLET | Freq: Every day | ORAL | 0 refills | Status: AC | PRN

## 2021-02-09 NOTE — Discharge Instructions (Signed)
Begin taking Lasix as prescribed as needed for leg swelling.  Keep your legs elevated as much as possible for the next 2 days.  Return at the given time for an ultrasound to rule out a blood clot.  Follow-up with primary doctor if not improving in the next few days.

## 2021-02-09 NOTE — ED Provider Notes (Signed)
[facility] EMERGENCY DEPARTMENT Provider Note   CSN: [id number] Arrival date & time: 02/08/21  1933     History Chief Complaint  Patient presents with  . Leg Pain    Lydia Alexander is a 59 y.o. female.  Patient is a 60 year old female with history of GERD and depression.  Patient presenting today for evaluation of bilateral lower extremity pain and discoloration for the past several days.  Patient states that this comes and goes on occasion.  She describes a discomfort to the anterior aspect of both lower legs along with some swelling of the ankles.  She denies calf pain or chest pain or difficulty breathing.  She denies any injury or trauma.  She denies any fevers or chills.  The history is provided by the patient.       Past Medical History:  Diagnosis Date  . Back pain   . Depression   . GERD (gastroesophageal reflux disease)     Patient Active Problem List   Diagnosis Date Noted  . Snoring 12/28/2020  . Portal hypertensive gastropathy ([place]) 11/23/2020  . Family history of colorectal cancer 11/23/2020  . Dysphagia   . Acute respiratory failure with hypoxia ([place]) 08/02/2020  . Community acquired pneumonia 08/02/2020  . Peripheral edema 08/02/2020  . Chronic back pain 08/02/2020    Past Surgical History:  Procedure Laterality Date  . BALLOON DILATION N/A 08/22/2020   Procedure: BALLOON DILATION;  Surgeon: Eloise Harman, DO;  [facility];  Service: Endoscopy;  Laterality: N/A;  . BIOPSY  08/22/2020   Procedure: BIOPSY;  Surgeon: Eloise Harman, DO;  [facility];  Service: Endoscopy;;  . CHOLECYSTECTOMY    . COLONOSCOPY WITH PROPOFOL N/A 01/23/2021   Procedure: COLONOSCOPY WITH PROPOFOL;  Surgeon: Eloise Harman, DO;  [facility];  Service: Endoscopy;  Laterality: N/A;  am  . DILATION AND CURETTAGE OF UTERUS    . ESOPHAGOGASTRODUODENOSCOPY (EGD) WITH PROPOFOL N/A 08/22/2020   Procedure: ESOPHAGOGASTRODUODENOSCOPY (EGD)  WITH PROPOFOL;  Surgeon: Eloise Harman, DO;  [facility];  Service: Endoscopy;  Laterality: N/A;  10:30  . POLYPECTOMY  01/23/2021   Procedure: POLYPECTOMY;  Surgeon: Eloise Harman, DO;  [facility];  Service: Endoscopy;;  . TUBAL LIGATION       OB History   No obstetric history on file.     Family History  Problem Relation Age of Onset  . Rectal cancer Father 40    Social History   Tobacco Use  . Smoking status: Current Every Day Smoker    Packs/day: 1.50    Years: 40.00    Pack years: 60.00    Types: Cigarettes  . Smokeless tobacco: Never Used  . Tobacco comment: smoking .75 pack daily-AH/01/01/2021  Vaping Use  . Vaping Use: Never used  Substance Use Topics  . Alcohol use: Yes    Comment: Drinks 1 glass bourbon, has been doing that "forever"  . Drug use: Never    Home Medications Prior to Admission medications   Medication Sig Start Date End Date Taking? Authorizing Provider  acetaminophen (TYLENOL) 650 MG CR tablet Take 1,300 mg by mouth every 8 (eight) hours.    [provider]  albuterol (VENTOLIN HFA) 108 (90 Base) MCG/ACT inhaler Inhale 2 puffs into the lungs every 6 (six) hours as needed for wheezing or shortness of breath. 08/05/20   Shelly Coss, MD  ALPRAZolam Duanne Moron) 1 MG tablet Take 1 mg by mouth  2 (two) times daily. 07/21/20   [provider]  atorvastatin (LIPITOR) 10 MG tablet Take 10 mg by mouth at bedtime. 06/16/20 01/17/21  [provider]  Cyanocobalamin (B-12) 2500 MCG TABS Take 2,500 mcg by mouth daily.    [provider]  cyclobenzaprine (FLEXERIL) 10 MG tablet Take 10 mg by mouth 2 (two) times daily as needed for muscle spasms.  05/30/20   [provider]  diphenhydrAMINE (BENADRYL) 25 MG tablet Take 25 mg by mouth at bedtime as needed for sleep.    [provider]  gabapentin (NEURONTIN) 300 MG capsule Take 600 mg by mouth in the morning, at noon, and at bedtime.   06/05/17 01/23/21  [provider]  naproxen (NAPROSYN) 500 MG tablet Take 1 tablet (500 mg total) by mouth daily as needed. Patient not taking: Reported on 01/17/2021 08/05/20   Shelly Coss, MD  nicotine (NICODERM CQ - DOSED IN MG/24 HOURS) 21 mg/24hr patch Place 1 patch (21 mg total) onto the skin daily. Patient not taking: Reported on 01/17/2021 08/06/20   Shelly Coss, MD  pantoprazole (PROTONIX) 40 MG tablet Take 1 tablet (40 mg total) by mouth daily. 11/23/20   Carlis Stable, NP  Tiotropium Bromide Monohydrate (SPIRIVA RESPIMAT) 2.5 MCG/ACT AERS Inhale 1 puff into the lungs daily. 11/15/20   Freddi Starr, MD  Vitamin D, Ergocalciferol, (DRISDOL) 1.25 MG (50000 UNIT) CAPS capsule Take 50,000 Units by mouth once a week. 06/22/20   [provider]    Allergies    Patient has no known allergies.  Review of Systems   Review of Systems  All other systems reviewed and are negative.   Physical Exam Updated Vital Signs BP (!) 147/89   Pulse 92   Temp 98 F (36.7 C) (Oral)   Resp 17   Ht 5' 2"  (1.575 m)   Wt 96.2 kg   SpO2 97%   BMI 38.78 kg/m   Physical Exam Vitals and nursing note reviewed.  Constitutional:      General: She is not in acute distress.    Appearance: She is well-developed. She is not diaphoretic.  HENT:     Head: Normocephalic and atraumatic.  Cardiovascular:     Rate and Rhythm: Normal rate and regular rhythm.     Heart sounds: No murmur heard. No friction rub. No gallop.   Pulmonary:     Effort: Pulmonary effort is normal. No respiratory distress.     Breath sounds: Normal breath sounds. No wheezing.  Abdominal:     General: Bowel sounds are normal. There is no distension.     Palpations: Abdomen is soft.     Tenderness: There is no abdominal tenderness.  Musculoskeletal:        General: Normal range of motion.     Cervical back: Normal range of motion and neck supple.     Right lower leg: Edema present.     Left lower leg: Edema  present.     Comments: There is trace edema of both lower extremities.  There is a very faint, pinkish discoloration to the anterior shins of both lower extremities.  There is no warmth or what I would consider erythema.  Skin:    General: Skin is warm and dry.  Neurological:     Mental Status: She is alert and oriented to person, place, and time.     ED Results / Procedures / Treatments   Labs (all labs ordered are listed, but only abnormal  results are displayed) Labs Reviewed - No data to display  EKG None  Radiology No results found.  Procedures Procedures   Medications Ordered in ED Medications - No data to display  ED Course  I have reviewed the triage vital signs and the nursing notes.  Pertinent labs & imaging results that were available during my care of the patient were reviewed by me and considered in my medical decision making (see chart for details).    MDM Rules/Calculators/A&P  Patient presenting here with leg swelling and discoloration as described in the HPI.  Patient does have trace edema, but no concerning signs of cellulitis.  I doubt DVT, but will set the patient up for ultrasound as an outpatient.  At this point, she will be discharged with a dose of Lasix which she can take as needed and see if this helps.  If not improving she is to follow-up with primary doctor.  Final Clinical Impression(s) / ED Diagnoses Final diagnoses:  None    Rx / DC Orders ED Discharge Orders    None       Veryl Speak, MD 02/09/21 0131

## 2021-02-21 ENCOUNTER — Other Ambulatory Visit: Payer: Self-pay

## 2021-02-21 ENCOUNTER — Ambulatory Visit: Payer: Medicare Other | Admitting: Nurse Practitioner

## 2021-02-21 ENCOUNTER — Encounter: Payer: Self-pay | Admitting: Nurse Practitioner

## 2021-02-21 VITALS — BP 141/88 | HR 86 | Temp 97.1°F | Ht 62.0 in | Wt 212.8 lb

## 2021-02-21 DIAGNOSIS — R932 Abnormal findings on diagnostic imaging of liver and biliary tract: Secondary | ICD-10-CM | POA: Diagnosis not present

## 2021-02-21 DIAGNOSIS — R1319 Other dysphagia: Secondary | ICD-10-CM

## 2021-02-21 DIAGNOSIS — R198 Other specified symptoms and signs involving the digestive system and abdomen: Secondary | ICD-10-CM | POA: Diagnosis not present

## 2021-02-21 NOTE — Patient Instructions (Signed)
Your health issues we discussed today were:   Dysphagia (swallowing difficulties): 1. I am glad your swallowing difficulties are doing better! 2. Continue to take your time eating, chew thoroughly, take small bites 3. Call us if you have any recurrent or returning symptoms  Conflicting results related to liver disease: 1. As we discussed, some of your results are conflicting 2. Some results indicate you may have liver disease, some results indicate you do not 3. At this point, I think we should proceed with a liver biopsy to try to find out for sure 4. Further recommendations will follow your biopsy  Overall I recommend:  1. Continue other current medications 2. Return for follow-up in 3 months 3. Call us for any questions or concerns   ---------------------------------------------------------------  I am glad you have gotten your COVID-19 vaccination!  Even though you are fully vaccinated you should continue to follow CDC and state/local guidelines.  ---------------------------------------------------------------   At [facility] we value your feedback. You may receive a survey about your visit today. Please share your experience as we strive to create trusting relationships with our patients to provide genuine, compassionate, quality care.  We appreciate your understanding and patience as we review any laboratory studies, imaging, and other diagnostic tests that are ordered as we care for you. Our office policy is 5 business days for review of these results, and any emergent or urgent results are addressed in a timely manner for your best interest. If you do not hear from our office in 1 week, please contact us.   We also encourage the use of MyChart, which contains your medical information for your review as well. If you are not enrolled in this feature, an access code is on this after visit summary for your convenience. Thank you for allowing [place] to be involved in  your care.  It was great to see you today!  I hope you have a happy Easter!!

## 2021-02-21 NOTE — Progress Notes (Signed)
Referring Provider: Bridget Hartshorn, NP Primary Care Physician:  Bridget Hartshorn, NP Primary GI:  Dr. Abbey Chatters  Chief Complaint  Patient presents with  . Dysphagia    Doing fine as long as she doesn't put too much in her mouth    HPI:   Lydia Alexander is a 60 y.o. female who presents for follow-up on dysphagia.  The patient was last seen in our office 11/23/2020 for dysphagia, portal hypertensive gastropathy, family history of colorectal cancer.  Dysphagia started during hospital admission for community-acquired pneumonia.  Noted esophageal dysmotility on barium pill esophagram.  Underwent endoscopy 08/22/2020 with a benign-appearing esophageal stenosis status post dilation, gastritis status post biopsy, portal hypertensive gastropathy, normal duodenum.  Recommended repeat EGD as needed.  No previous abdominal imaging prior to that, LFTs normal, no historical mention of cirrhosis.  At last visit noted breathing better but still on oxygen, dysphagia improved, taking adequate precautions for chewing/swallowing.  Some blood on the toilet tissue but feels it is vaginal etiology and has a transabdominal/transvaginal ultrasound recently done.  No other overt GI complaints.  Father had rectal cancer in his 30s.  Recommended colonoscopy, update labs, abdominal ultrasound liver elastography, refill Protonix, follow-up in 3 months.  Labs were partially completed 12/05/2020 with HFP showing normal LFTs, BMP essentially normal.  Right upper quadrant ultrasound with elastography completed 12/05/2020 which found diffusely increased heterogenous [place] coarsened hepatic parenchyma, no focal liver lesions.  Suggestive of cirrhosis on imaging but elastography was less than or equal to 5 kPa with a high probability of being normal.  Colonoscopy was completed 01/23/2021 which found nonbleeding internal hemorrhoids, diverticulosis in the sigmoid colon and descending colon, single 3 mm polyp in the ascending  colon, two 47 mm polyps in the transverse colon, otherwise normal.  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  Today she states she doing okay overall. Dysphagia doing better since dilation. She is intentionally trying to eat smaller bites, chew thoroughly, softer foods. Denies abdominal pain, N/V, hematochezia, melena, fever, chills, unintentional weight loss. Breathing is better, is only on oxygen at night. Denies URI or flu-like symptoms. Denies loss of sense of taste or smell. The patient has received COVID-19 vaccination(s). Denies chest pain, dyspnea, dizziness, lightheadedness, syncope, near syncope. Denies any other upper or lower GI symptoms.  Past Medical History:  Diagnosis Date  . Back pain   . Depression   . GERD (gastroesophageal reflux disease)     Past Surgical History:  Procedure Laterality Date  . BALLOON DILATION N/A 08/22/2020   Procedure: BALLOON DILATION;  Surgeon: Eloise Harman, DO;  [facility];  Service: Endoscopy;  Laterality: N/A;  . BIOPSY  08/22/2020   Procedure: BIOPSY;  Surgeon: Eloise Harman, DO;  [facility];  Service: Endoscopy;;  . CHOLECYSTECTOMY    . COLONOSCOPY WITH PROPOFOL N/A 01/23/2021   Procedure: COLONOSCOPY WITH PROPOFOL;  Surgeon: Eloise Harman, DO;  [facility];  Service: Endoscopy;  Laterality: N/A;  am  . DILATION AND CURETTAGE OF UTERUS    . ESOPHAGOGASTRODUODENOSCOPY (EGD) WITH PROPOFOL N/A 08/22/2020   Procedure: ESOPHAGOGASTRODUODENOSCOPY (EGD) WITH PROPOFOL;  Surgeon: Eloise Harman, DO;  [facility];  Service: Endoscopy;  Laterality: N/A;  10:30  . POLYPECTOMY  01/23/2021   Procedure: POLYPECTOMY;  Surgeon: Eloise Harman, DO;  [facility];  Service: Endoscopy;;  . TUBAL  LIGATION      Current Outpatient Medications  Medication Sig Dispense Refill  . acetaminophen  (TYLENOL) 650 MG CR tablet Take 1,300 mg by mouth every 8 (eight) hours.    Marland Kitchen albuterol (VENTOLIN HFA) 108 (90 Base) MCG/ACT inhaler Inhale 2 puffs into the lungs every 6 (six) hours as needed for wheezing or shortness of breath. 8 g 2  . ALPRAZolam (XANAX) 1 MG tablet Take 1 mg by mouth 2 (two) times daily.    Marland Kitchen atorvastatin (LIPITOR) 10 MG tablet Take 10 mg by mouth at bedtime.    . Cyanocobalamin (B-12) 2500 MCG TABS Take 2,500 mcg by mouth daily.    . cyclobenzaprine (FLEXERIL) 10 MG tablet Take 10 mg by mouth 2 (two) times daily as needed for muscle spasms.     . diphenhydrAMINE (BENADRYL) 25 MG tablet Take 25 mg by mouth at bedtime as needed for sleep.    . furosemide (LASIX) 20 MG tablet Take 1 tablet (20 mg total) by mouth daily as needed. 30 tablet 0  . gabapentin (NEURONTIN) 300 MG capsule Take 600 mg by mouth in the morning, at noon, and at bedtime.     . pantoprazole (PROTONIX) 40 MG tablet Take 1 tablet (40 mg total) by mouth daily. 90 tablet 1  . Vitamin D, Ergocalciferol, (DRISDOL) 1.25 MG (50000 UNIT) CAPS capsule Take 50,000 Units by mouth once a week.    . naproxen (NAPROSYN) 500 MG tablet Take 1 tablet (500 mg total) by mouth daily as needed. (Patient not taking: No sig reported)    . nicotine (NICODERM CQ - DOSED IN MG/24 HOURS) 21 mg/24hr patch Place 1 patch (21 mg total) onto the skin daily. (Patient not taking: No sig reported) 28 patch 1  . Tiotropium Bromide Monohydrate (SPIRIVA RESPIMAT) 2.5 MCG/ACT AERS Inhale 1 puff into the lungs daily. (Patient not taking: Reported on 02/21/2021) 4 g 0   No current facility-administered medications for this visit.    Allergies as of 02/21/2021  . (No Known Allergies)    Family History  Problem Relation Age of Onset  . Rectal cancer Father 37    Social History   Socioeconomic History  . Marital status: Widowed    Spouse name: Not on file  . Number of children: Not on file  . Years of education: Not on file  . Highest  education level: Not on file  Occupational History  . Not on file  Tobacco Use  . Smoking status: Current Every Day Smoker    Packs/day: 1.50    Years: 40.00    Pack years: 60.00    Types: Cigarettes  . Smokeless tobacco: Never Used  . Tobacco comment: smoking .75 pack daily-AH/01/01/2021  Vaping Use  . Vaping Use: Never used  Substance and Sexual Activity  . Alcohol use: Yes    Comment: Drinks 1 glass bourbon, has been doing that "forever"  . Drug use: Never  . Sexual activity: Not Currently  Other Topics Concern  . Not on file  Social History Narrative  . Not on file   Social Determinants of Health   Financial Resource Strain: Not on file  Food Insecurity: Not on file  Transportation Needs: Not on file  Physical Activity: Not on file  Stress: Not on file  Social Connections: Not on file    Subjective: Review of Systems  Constitutional: Negative for chills, fever, malaise/fatigue and weight loss.  HENT: Negative for congestion and sore throat.   Respiratory: Negative for  cough and shortness of breath.   Cardiovascular: Negative for chest pain and palpitations.  Gastrointestinal: Negative for abdominal pain, blood in stool, diarrhea, melena, nausea and vomiting.  Musculoskeletal: Negative for joint pain and myalgias.  Skin: Negative for rash.  Neurological: Negative for dizziness and weakness.  Endo/Heme/Allergies: Does not bruise/bleed easily.  Psychiatric/Behavioral: Negative for depression. The patient is not nervous/anxious.   All other systems reviewed and are negative.    Objective: BP (!) 141/88   Pulse 86   Temp (!) 97.1 F (36.2 C)   Ht 5' 2"  (1.575 m)   Wt 212 lb 12.8 oz (96.5 kg)   BMI 38.92 kg/m  Physical Exam Vitals and nursing note reviewed.  Constitutional:      General: She is not in acute distress.    Appearance: Normal appearance. She is well-developed. She is not ill-appearing, toxic-appearing or diaphoretic.  HENT:     Head:  Normocephalic and atraumatic.     Nose: No congestion or rhinorrhea.  Eyes:     General: No scleral icterus. Cardiovascular:     Rate and Rhythm: Normal rate and regular rhythm.     Heart sounds: Normal heart sounds.  Pulmonary:     Effort: Pulmonary effort is normal. No respiratory distress.     Breath sounds: Normal breath sounds.  Abdominal:     General: Bowel sounds are normal.     Palpations: Abdomen is soft. There is no hepatomegaly, splenomegaly or mass.     Tenderness: There is no abdominal tenderness. There is no guarding or rebound.     Hernia: No hernia is present.  Skin:    General: Skin is warm and dry.     Coloration: Skin is not jaundiced.     Findings: No rash.  Neurological:     General: No focal deficit present.     Mental Status: She is alert and oriented to person, place, and time.  Psychiatric:        Attention and Perception: Attention normal.        Mood and Affect: Mood normal.        Speech: Speech normal.        Behavior: Behavior normal.        Thought Content: Thought content normal.        Cognition and Memory: Cognition and memory normal.      Assessment:  Very pleasant 60 year old female who presents to follow-up on dysphagia.  Currently her dysphagia doing well.  She is taking adequate chewing and swallowing precautions and has not had any difficulty since her dilation.  We also discussed the results of her LFTs, EGD (apparent portal hypertensive gastropathy) and ultrasound (apparent early cirrhosis) and elastography (apparent normal).  Abnormal EGD and ultrasound, normal elastography and LFTs: We discussed that it does not really leave [place] with a definitive diagnosis given the disjointed results on multiple test.  At this point I offered a liver biopsy to try to discern for sure whether or not she has cirrhosis or simply fatty liver disease.  She is agreeable to proceed and we will put this order in.  Further recommendations will follow.  If she  does have advanced scarring or cirrhosis she will need routine cirrhosis care including every 63-monthoffice visit with labs and hepatoma screening, regular EGDs for screening for varices.   Plan: 1. Continue current medications 2. Liver biopsy 3. Follow-up in 3 months    Thank you for allowing [place] participate in  the care of Athalia, DNP, AGNP-C Adult & Gerontological Nurse Practitioner [facility]   02/21/2021 3:58 PM   Disclaimer: This note was dictated with voice recognition software. Similar sounding words can inadvertently be transcribed and may not be corrected upon review.

## 2021-02-26 ENCOUNTER — Telehealth (HOSPITAL_COMMUNITY): Payer: Self-pay

## 2021-03-02 ENCOUNTER — Encounter (HOSPITAL_COMMUNITY): Payer: Self-pay

## 2021-03-02 NOTE — Progress Notes (Signed)
     Sharon Hill Female, 60 y.o., November 02, 1961  MRN:  [id number] Phone:  [phone] (H)       PCP:  Bridget Hartshorn, NP Coverage:  [place] [facility] ([facility]) 03/09/2021 at 1:00 PM           RE: Biopsy Received: 4 days ago  Message Details  Greggory Keen, MD  Quina Wilbourne E [place] RANDOM LIVER CORE BX FOR ABNL LFTS    TS    Previous Messages  ----- Message -----  From: Lenore Cordia  Sent: 02/26/2021 11:00 AM EDT  To: Ir Procedure Requests  Subject: Biopsy                      Random?   Procedure Requested: US Biopsy (liver)    Reason for Procedure: abnormal ultrasound    Provider Requesting: Carlis Stable  Provider Telephone: [phone]

## 2021-03-08 ENCOUNTER — Other Ambulatory Visit: Payer: Self-pay | Admitting: Radiology

## 2021-03-09 ENCOUNTER — Encounter (HOSPITAL_COMMUNITY): Payer: Self-pay

## 2021-03-09 ENCOUNTER — Other Ambulatory Visit: Payer: Self-pay

## 2021-03-09 ENCOUNTER — Ambulatory Visit (HOSPITAL_COMMUNITY)
Admission: RE | Admit: 2021-03-09 | Discharge: 2021-03-09 | Disposition: A | Discharge status: Home / Self Care | Payer: Medicare Other | Source: Ambulatory Visit | Attending: Nurse Practitioner | Admitting: Nurse Practitioner

## 2021-03-09 DIAGNOSIS — R932 Abnormal findings on diagnostic imaging of liver and biliary tract: Secondary | ICD-10-CM | POA: Insufficient documentation

## 2021-03-09 DIAGNOSIS — Z79899 Other long term (current) drug therapy: Secondary | ICD-10-CM | POA: Diagnosis not present

## 2021-03-09 DIAGNOSIS — K7581 Nonalcoholic steatohepatitis (NASH): Secondary | ICD-10-CM | POA: Insufficient documentation

## 2021-03-09 DIAGNOSIS — F17210 Nicotine dependence, cigarettes, uncomplicated: Secondary | ICD-10-CM | POA: Insufficient documentation

## 2021-03-09 LAB — CBC
HCT: 42.4 % (ref 36.0–46.0)
Hemoglobin: 13.1 g/dL (ref 12.0–15.0)
MCH: 32.8 pg (ref 26.0–34.0)
MCHC: 30.9 g/dL (ref 30.0–36.0)
MCV: 106.3 fL — ABNORMAL HIGH (ref 80.0–100.0)
Platelets: 188 10*3/uL (ref 150–400)
RBC: 3.99 MIL/uL (ref 3.87–5.11)
RDW: 13.4 % (ref 11.5–15.5)
WBC: 6.8 10*3/uL (ref 4.0–10.5)
nRBC: 0 % (ref 0.0–0.2)

## 2021-03-09 LAB — PROTIME-INR
INR: 0.9 (ref 0.8–1.2)
Prothrombin Time: 12 seconds (ref 11.4–15.2)

## 2021-03-09 MED ORDER — GELATIN ABSORBABLE 12-7 MM EX MISC
CUTANEOUS | Status: AC
  Filled 2021-03-09: qty 1

## 2021-03-09 MED ORDER — SODIUM CHLORIDE 0.9 % IV SOLN: INTRAVENOUS | Status: DC

## 2021-03-09 MED ORDER — MIDAZOLAM HCL 2 MG/2ML IJ SOLN
INTRAMUSCULAR | Status: AC | PRN
  Administered 2021-03-09: 1 mg via INTRAVENOUS

## 2021-03-09 MED ORDER — FENTANYL CITRATE (PF) 100 MCG/2ML IJ SOLN
INTRAMUSCULAR | Status: AC | PRN
  Administered 2021-03-09: 50 ug via INTRAVENOUS

## 2021-03-09 MED ORDER — MIDAZOLAM HCL 2 MG/2ML IJ SOLN
INTRAMUSCULAR | Status: AC
  Filled 2021-03-09: qty 2

## 2021-03-09 MED ORDER — FENTANYL CITRATE (PF) 100 MCG/2ML IJ SOLN
INTRAMUSCULAR | Status: AC
  Filled 2021-03-09: qty 2

## 2021-03-09 MED ORDER — LIDOCAINE HCL (PF) 1 % IJ SOLN
INTRAMUSCULAR | Status: AC
  Filled 2021-03-09: qty 30

## 2021-03-09 MED ORDER — HYDROCODONE-ACETAMINOPHEN 5-325 MG PO TABS
1.0000 | ORAL_TABLET | ORAL | Status: DC | PRN
Start: 2021-03-09 — End: 2021-03-10
  Filled 2021-03-09: qty 2

## 2021-03-09 NOTE — Discharge Instructions (Signed)
Liver Biopsy, Care After These instructions give you information on caring for yourself after your procedure. Your doctor may also give you more specific instructions. Call your doctor if you have any problems or questions after your procedure. What can I expect after the procedure? After the procedure, it is common to have:  Pain and soreness where the biopsy was done.  Bruising around the area where the biopsy was done.  Sleepiness and be tired for a few days. Follow these instructions at home: Medicines  Take over-the-counter and prescription medicines only as told by your doctor.  If you were prescribed an antibiotic medicine, take it as told by your doctor. Do not stop taking the antibiotic even if you start to feel better.  Do not take medicines such as aspirin and ibuprofen. These medicines can thin your blood. Do not take these medicines unless your doctor tells you to take them.  If you are taking prescription pain medicine, take actions to prevent or treat constipation. Your doctor may recommend that you: ? Drink enough fluid to keep your pee (urine) clear or pale yellow. ? Take over-the-counter or prescription medicines. ? Eat foods that are high in fiber, such as fresh fruits and vegetables, whole grains, and beans. ? Limit foods that are high in fat and processed sugars, such as fried and sweet foods. Caring for your cut  Follow instructions from your doctor about how to take care of your cuts from surgery (incisions). Make sure you: ? Wash your hands with soap and water before you change your bandage (dressing). If you cannot use soap and water, use hand sanitizer. ? Change your bandage as told by your doctor. ? Leave stitches (sutures), skin glue, or skin tape (adhesive) strips in place. They may need to stay in place for 2 weeks or longer. If tape strips get loose and curl up, you may trim the loose edges. Do not remove tape strips completely unless your doctor says it is  okay.  Check your cuts every day for signs of infection. Check for: ? Redness, swelling, or more pain. ? Fluid or blood. ? Pus or a bad smell. ? Warmth.  Do not take baths, swim, or use a hot tub until your doctor says it is okay to do so. Activity  Rest at home for 1-2 days or as told by your doctor. ? Avoid sitting for a long time without moving. Get up to take short walks every 1-2 hours.  Return to your normal activities as told by your doctor. Ask what activities are safe for you.  Do not do these things in the first 24 hours: ? Drive. ? Use machinery. ? Take a bath or shower.  Do not lift more than 10 pounds (4.5 kg) or play contact sports for the first 2 weeks.   General instructions  Do not drink alcohol in the first week after the procedure.  Have someone stay with you for at least 24 hours after the procedure.  Get your test results. Ask your doctor or the department that is doing the test: ? When will my results be ready? ? How will I get my results? ? What are my treatment options? ? What other tests do I need? ? What are my next steps?  Keep all follow-up visits as told by your doctor. This is important.   Contact a doctor if:  A cut bleeds and leaves more than just a small spot of blood.  A cut is red,  puffs up (swells), or hurts more than before.  Fluid or something else comes from a cut.  A cut smells bad.  You have a fever or chills. Get help right away if:  You have swelling, bloating, or pain in your belly (abdomen).  You get dizzy or faint.  You have a rash.  You feel sick to your stomach (nauseous) or throw up (vomit).  You have trouble breathing, feel short of breath, or feel faint.  Your chest hurts.  You have problems talking or seeing.  You have trouble with your balance or moving your arms or legs. Summary  After the procedure, it is common to have pain, soreness, bruising, and tiredness.  Your doctor will tell you how to  take care of yourself at home. Change your bandage, take your medicines, and limit your activities as told by your doctor.  Call your doctor if you have symptoms of infection. Get help right away if your belly swells, your cut bleeds a lot, or you have trouble talking or breathing. This information is not intended to replace advice given to you by your health care provider. Make sure you discuss any questions you have with your health care provider. Document Revised: 11/06/2017 Document Reviewed: 11/07/2017 Elsevier Patient Education  2021 [employer].

## 2021-03-09 NOTE — H&P (Signed)
Chief Complaint: Abnormal LFTs  Referring Physician(s): Walden Field  Supervising Physician: Arne Cleveland  Patient Status: [facility] - Out-pt  History of Present Illness: Lydia Alexander is a 60 y.o. female who is being evaluated by GI for dysphagia which started during hospital admission for community-acquired pneumonia.  Noted esophageal dysmotility on barium pill esophagram.    Labs done 12/05/2020 showed normal LFTs, BMP essentially normal.  Right upper quadrant ultrasound with elastography done 12/05/2020= Diffusely hyperechoic coarsened hepatic echotexture, suggestive of cirrhosis, without visualized suspicious hepatic lesion. Suggestive of cirrhosis on imaging but elastography was less than or equal to 5 kPa with a high probability of being normal.  We are asked to perform an image guided random liver biopsy.  She is NPO. No nausea/vomiting. No Fever/chills. ROS negative.   Past Medical History:  Diagnosis Date  . Back pain   . Depression   . GERD (gastroesophageal reflux disease)     Past Surgical History:  Procedure Laterality Date  . BALLOON DILATION N/A 08/22/2020   Procedure: BALLOON DILATION;  Surgeon: Eloise Harman, DO;  [facility];  Service: Endoscopy;  Laterality: N/A;  . BIOPSY  08/22/2020   Procedure: BIOPSY;  Surgeon: Eloise Harman, DO;  [facility];  Service: Endoscopy;;  . CHOLECYSTECTOMY    . COLONOSCOPY WITH PROPOFOL N/A 01/23/2021   Procedure: COLONOSCOPY WITH PROPOFOL;  Surgeon: Eloise Harman, DO;  Location: [facility];  Service: Endoscopy;  Laterality: N/A;  am  . DILATION AND CURETTAGE OF UTERUS    . ESOPHAGOGASTRODUODENOSCOPY (EGD) WITH PROPOFOL N/A 08/22/2020   Procedure: ESOPHAGOGASTRODUODENOSCOPY (EGD) WITH PROPOFOL;  Surgeon: Eloise Harman, DO;  [facility];  Service: Endoscopy;  Laterality: N/A;  10:30  . POLYPECTOMY  01/23/2021   Procedure: POLYPECTOMY;  Surgeon: Eloise Harman, DO;   Location[facility];  Service: Endoscopy;;  . TUBAL LIGATION      Allergies: Patient has no known allergies.  Medications: Prior to Admission medications   Medication Sig Start Date End Date Taking? Authorizing Provider  acetaminophen (TYLENOL) 650 MG CR tablet Take 1,300 mg by mouth every 8 (eight) hours as needed for pain.   Yes [provider]  ALPRAZolam Duanne Moron) 1 MG tablet Take 1 mg by mouth 2 (two) times daily. 07/21/20  Yes [provider]  atorvastatin (LIPITOR) 10 MG tablet Take 10 mg by mouth at bedtime.   Yes [provider]  cyanocobalamin 2000 MCG tablet Take 2,000 mcg by mouth at bedtime.   Yes [provider]  cyclobenzaprine (FLEXERIL) 10 MG tablet Take 10 mg by mouth 3 (three) times daily as needed for muscle spasms. 05/30/20  Yes [provider]  diphenhydrAMINE (BENADRYL) 25 MG tablet Take 25 mg by mouth at bedtime as needed for sleep.   Yes [provider]  furosemide (LASIX) 20 MG tablet Take 1 tablet (20 mg total) by mouth daily as needed. Patient taking differently: Take 20 mg by mouth daily as needed for edema. 02/09/21  Yes Delo, Nathaneil Canary, MD  gabapentin (NEURONTIN) 300 MG capsule Take 600 mg by mouth 3 (three) times daily.   Yes [provider]  pantoprazole (PROTONIX) 40 MG tablet Take 1 tablet (40 mg total) by mouth daily. Patient taking differently: Take 40 mg by mouth at bedtime. 11/23/20  Yes Carlis Stable, NP  Vitamin D, Ergocalciferol, (DRISDOL) 1.25 MG (50000 UNIT) CAPS capsule Take 50,000 Units by mouth every Tuesday. 06/22/20  Yes [provider]  albuterol (VENTOLIN HFA) 108 (90 Base) MCG/ACT inhaler Inhale 2 puffs into the lungs every 6 (six) hours as needed for wheezing or shortness of breath. 08/05/20   Shelly Coss, MD     Family History  Problem Relation Age of Onset  . Rectal cancer Father 16    Social History   Socioeconomic History  . Marital status: Widowed    Spouse  name: Not on file  . Number of children: Not on file  . Years of education: Not on file  . Highest education level: Not on file  Occupational History  . Not on file  Tobacco Use  . Smoking status: Current Every Day Smoker    Packs/day: 1.00    Years: 40.00    Pack years: 40.00    Types: Cigarettes  . Smokeless tobacco: Never Used  . Tobacco comment: smoking .75 pack daily-AH/01/01/2021  Vaping Use  . Vaping Use: Never used  Substance and Sexual Activity  . Alcohol use: Yes    Comment: Drinks 1 glass bourbon, has been doing that "forever"  . Drug use: Never  . Sexual activity: Not Currently  Other Topics Concern  . Not on file  Social History Narrative  . Not on file   Social Determinants of Health   Financial Resource Strain: Not on file  Food Insecurity: Not on file  Transportation Needs: Not on file  Physical Activity: Not on file  Stress: Not on file  Social Connections: Not on file     Review of Systems: A 12 point ROS discussed and pertinent positives are indicated in the HPI above.  All other systems are negative.  Review of Systems  Vital Signs: BP 139/64   Pulse 83   Temp 98.3 F (36.8 C) (Oral)   Resp 18   Ht 5' 2"  (1.575 m)   Wt 96.2 kg   SpO2 97%   BMI 38.78 kg/m   Physical Exam Vitals reviewed.  Constitutional:      Appearance: Normal appearance.  HENT:     Head: Normocephalic and atraumatic.  Eyes:     Extraocular Movements: Extraocular movements intact.  Cardiovascular:     Rate and Rhythm: Normal rate and regular rhythm.  Pulmonary:     Effort: Pulmonary effort is normal. No respiratory distress.     Breath sounds: Normal breath sounds.  Abdominal:     General: There is no distension.     Palpations: Abdomen is soft.     Tenderness: There is no abdominal tenderness.  Musculoskeletal:        General: Normal range of motion.  Skin:    General: Skin is warm and dry.  Neurological:     General: No focal deficit present.     Mental  Status: She is alert and oriented to person, place, and time.  Psychiatric:        Mood and Affect: Mood normal.        Behavior: Behavior normal.        Thought Content: Thought content normal.        Judgment: Judgment normal.     Imaging: No results found.  Labs:  CBC: Recent Labs    03/18/20 0027 08/02/20 1054 08/03/20 0615 02/09/21 0028  WBC 6.4 7.3 6.3 7.4  HGB 15.2* 13.0 13.0 14.3  HCT 47.6* 43.7 42.9 45.4  PLT 197 173 155 203    COAGS: No results for input(s): INR, APTT in the last 8760 hours.  BMP: Recent Labs    03/18/20 0027 08/02/20 1054 08/03/20 0615 12/05/20 1219 02/09/21 0028  NA 141 140 140 139 137  K 3.5 3.8 4.1 3.4* 4.0  CL 105 103 105 103 102  CO2 25 29 29 28 27   GLUCOSE 122* 101* 109* 129* 108*  BUN 10 12 10 15 9   CALCIUM 8.6* 8.5* 8.1* 8.7* 8.5*  CREATININE 0.76 0.74 0.62 0.73 0.82  GFRNONAA >60 >60 >60 >60 >60  GFRAA >60 >60 >60  --   --     LIVER FUNCTION TESTS: Recent Labs    03/18/20 0027 08/03/20 0615 12/05/20 1219  BILITOT 0.5 0.9 0.7  AST 21 11* 12*  ALT 27 16 16   ALKPHOS 113 83 78  PROT 7.3 6.2* 6.3*  ALBUMIN 3.8 3.1* 3.4*    TUMOR MARKERS: No results for input(s): AFPTM, CEA, CA199, CHROMGRNA in the last 8760 hours.  Assessment and Plan:  Diffusely hyperechoic coarsened hepatic echotexture, suggestive of cirrhosis, without visualized suspicious hepatic lesion.  Will proceed with image guided random liver biopsy today by Dr. Vernard Gambles.  Risks and benefits of random liver biopsy was discussed with the patient and/or patient's family including, but not limited to bleeding, infection, damage to adjacent structures or low yield requiring additional tests.  All of the questions were answered and there is agreement to proceed.  Consent signed and in chart.  Thank you for this interesting consult.  I greatly enjoyed meeting Kanchan Gal and look forward to participating in their care.  A copy of this report was sent  to the requesting provider on this date.  Electronically Signed: Murrell Redden, PA-C   03/09/2021, 11:42 AM      I spent a total of  15 Minutes   in face to face in clinical consultation, greater than 50% of which was counseling/coordinating care for random liver biopsy.

## 2021-03-09 NOTE — Procedures (Signed)
  Procedure: [place] core liver biopsy   EBL:   minimal Complications:  none immediate  See full dictation in [employer].  Dillard Cannon MD Main # [phone] Pager  [phone] Mobile [phone]

## 2021-03-16 LAB — SURGICAL PATHOLOGY

## 2021-03-19 ENCOUNTER — Other Ambulatory Visit: Payer: Self-pay | Admitting: Nurse Practitioner

## 2021-03-19 DIAGNOSIS — K766 Portal hypertension: Secondary | ICD-10-CM

## 2021-03-19 DIAGNOSIS — K3189 Other diseases of stomach and duodenum: Secondary | ICD-10-CM

## 2021-03-19 DIAGNOSIS — R1319 Other dysphagia: Secondary | ICD-10-CM

## 2021-03-23 ENCOUNTER — Encounter (HOSPITAL_COMMUNITY): Payer: Self-pay

## 2021-04-02 ENCOUNTER — Other Ambulatory Visit: Payer: Self-pay

## 2021-04-02 ENCOUNTER — Other Ambulatory Visit (HOSPITAL_COMMUNITY): Payer: Self-pay | Admitting: Adult Health Nurse Practitioner

## 2021-04-02 ENCOUNTER — Ambulatory Visit (HOSPITAL_COMMUNITY)
Admission: RE | Admit: 2021-04-02 | Discharge: 2021-04-02 | Disposition: A | Discharge status: Home / Self Care | Payer: Medicare Other | Source: Ambulatory Visit | Attending: Acute Care | Admitting: Acute Care

## 2021-04-02 DIAGNOSIS — F17210 Nicotine dependence, cigarettes, uncomplicated: Secondary | ICD-10-CM | POA: Insufficient documentation

## 2021-04-02 DIAGNOSIS — Z87891 Personal history of nicotine dependence: Secondary | ICD-10-CM | POA: Diagnosis present

## 2021-04-02 DIAGNOSIS — Z1231 Encounter for screening mammogram for malignant neoplasm of breast: Secondary | ICD-10-CM

## 2021-04-11 ENCOUNTER — Other Ambulatory Visit: Payer: Self-pay

## 2021-04-11 ENCOUNTER — Ambulatory Visit (HOSPITAL_COMMUNITY)
Admission: RE | Admit: 2021-04-11 | Discharge: 2021-04-11 | Disposition: A | Discharge status: Home / Self Care | Payer: Medicare Other | Source: Ambulatory Visit | Attending: Adult Health Nurse Practitioner | Admitting: Adult Health Nurse Practitioner

## 2021-04-11 DIAGNOSIS — Z1231 Encounter for screening mammogram for malignant neoplasm of breast: Secondary | ICD-10-CM | POA: Diagnosis present

## 2021-04-13 ENCOUNTER — Ambulatory Visit (HOSPITAL_COMMUNITY): Admission: RE | Admit: 2021-04-13 | Payer: Medicare Other | Source: Ambulatory Visit

## 2021-04-19 ENCOUNTER — Telehealth: Payer: Self-pay | Admitting: Acute Care

## 2021-04-19 NOTE — Progress Notes (Signed)
I have attempted to call the patient with the results of her scan. There was no answer. I have left a HIPPA compliant message with the office contact number asking the patient return the call to the office.  Lydia Alexander, plan is for non-urgent PET, as the radiologist feels this is post infectious inflammatory scarring. I will wait to place order until I have spoken with the patient.  If I do not hear back from the patient I will call again in the next few days.

## 2021-04-19 NOTE — Telephone Encounter (Signed)
This is a [facility] patient.  Thank you.

## 2021-04-20 ENCOUNTER — Telehealth: Payer: Self-pay | Admitting: Acute Care

## 2021-04-20 DIAGNOSIS — F17210 Nicotine dependence, cigarettes, uncomplicated: Secondary | ICD-10-CM

## 2021-04-20 DIAGNOSIS — Z87891 Personal history of nicotine dependence: Secondary | ICD-10-CM

## 2021-04-20 NOTE — Telephone Encounter (Signed)
I called the patient with the results of her low dose Ct Chest. I explained that the scan was read as a  Lung RADS 4 B indicates suspicious findings for which additional diagnostic testing and or tissue sampling is recommended. Per radiology, the area of a Slight enlargement of the nodular area of architectural distortion in the inferior segment of the lingula technically categorized as Lung-RADS 4BS, suspicious. However, this region is a very common location for areas of progressive post infectious or inflammatory scarring. Dr. Valeta Harms reviewed the scan with me, and agreed that this may represent scarring or infection/ inflammation. Plan is for a 3 month follow up low dose CT chest. Pt. Is in agreement with the above plan.  Denise, please order 3 month follow up low dose CT and fax results to PCP, let them know the plan. Thanks so much.

## 2021-04-20 NOTE — Telephone Encounter (Signed)
See telephone note dated 6/10>> patient has been called with results

## 2021-04-20 NOTE — Telephone Encounter (Signed)
Ct results faxed to PCP. Order placed for 3 mth f/u low dose nodule f/u CT.

## 2021-04-20 NOTE — Telephone Encounter (Signed)
Will forward to Eric Form, NP as pt is returning her call.

## 2021-05-03 ENCOUNTER — Telehealth: Payer: Self-pay

## 2021-05-03 NOTE — Telephone Encounter (Signed)
Referral notes sent from [facility], Phone #: [phone], Fax #: [phone]   Notes sent to scheduling

## 2021-05-08 ENCOUNTER — Ambulatory Visit (HOSPITAL_COMMUNITY)
Admission: RE | Admit: 2021-05-08 | Discharge: 2021-05-08 | Disposition: A | Discharge status: Home / Self Care | Payer: Medicare Other | Source: Ambulatory Visit | Attending: Emergency Medicine | Admitting: Emergency Medicine

## 2021-05-08 ENCOUNTER — Other Ambulatory Visit: Payer: Self-pay

## 2021-05-08 ENCOUNTER — Ambulatory Visit: Payer: Medicare Other | Admitting: Nurse Practitioner

## 2021-05-08 DIAGNOSIS — R600 Localized edema: Secondary | ICD-10-CM | POA: Insufficient documentation

## 2021-05-23 ENCOUNTER — Encounter: Payer: Self-pay | Admitting: Gastroenterology

## 2021-05-23 ENCOUNTER — Ambulatory Visit: Payer: Medicare Other | Admitting: Gastroenterology

## 2021-05-23 ENCOUNTER — Other Ambulatory Visit: Payer: Self-pay

## 2021-05-23 VITALS — BP 138/87 | HR 99 | Temp 98.2°F | Ht 63.0 in | Wt 208.4 lb

## 2021-05-23 DIAGNOSIS — R1319 Other dysphagia: Secondary | ICD-10-CM | POA: Diagnosis not present

## 2021-05-23 DIAGNOSIS — K7581 Nonalcoholic steatohepatitis (NASH): Secondary | ICD-10-CM | POA: Insufficient documentation

## 2021-05-23 DIAGNOSIS — K219 Gastro-esophageal reflux disease without esophagitis: Secondary | ICD-10-CM | POA: Diagnosis not present

## 2021-05-23 NOTE — Progress Notes (Signed)
Referring Provider: Bridget Hartshorn, NP Primary Care Physician:  Bridget Hartshorn, NP Primary GI Physician: Abbey Chatters  Chief Complaint  Patient presents with   Dysphagia    HPI:   Lydia Alexander is a 60 y.o. 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   Dysphagia: s/p esophageal dilation 08/22/20. States that dysphagia has improved since dilation but is having some ongoing issues. 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 She reports dysphagia occurs a few times per week still.   GERD:  Protonix 64m once daily. Denies burning in chest or throat. She does endorse excessive belching at times, however, no pain associated with belching. She denies sore throat, cough or hoarse voice.   NASH: History of abnormal Liver on [place] showed hyperechoic coarsened hepatic echotexture suggestive of cirrhosis, however liver elastography (12/05/20)  WNL (2.5) and LFTs WNL (11/2020). Liver biopsy ordered at last GI visit revealed liver fibrosis stage 1A of 4. States that she does drink bourbon, usually buys 1/2 gallon that it takes her 2-3 months.   Denies nausea, vomiting, constipation, diarrhea, melena, hematochezia, unintentional weight loss.   Last Colonoscopy:01/23/21 Non-bleeding internal hemorrhoids. Diverticulosis in the sigmoid colon and in the descending colon. One 3 mm polyp in the ascending colon, removed,Resected and retrieved. Two 4 to 7 mm polyps in the transverse colon, removed,Resected and retrieved (both tubular adenomas without high grade dysplasia). The examination was otherwise normal.  Last Endoscopy: 08/22/20 benign appearing esophageal stenosis s/p dilation, gastritis, s/p biopsy, portal hypertensive gastropathy, normal duodenum.    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  Recommendations:  Repeat surveillance Colonoscopy in 2027 depending on overall health (r/t tubular adenomas)  Familial GI History: Father had colorectal cancer in late 855s Past Medical History:  Diagnosis Date   Back pain    Depression    GERD (gastroesophageal reflux disease)     Past Surgical History:  Procedure Laterality Date   BALLOON DILATION N/A 08/22/2020   Procedure: BALLOON DILATION;  Surgeon: CEloise Harman DO;  [facility];  Service: Endoscopy;  Laterality: N/A;   BIOPSY  08/22/2020   Procedure: BIOPSY;  Surgeon: CEloise Harman DO;  [facility];  Service: Endoscopy;;   CHOLECYSTECTOMY     COLONOSCOPY WITH PROPOFOL N/A 01/23/2021   carver, Non-bleeding internal hemorrhoids. Diverticulosis in the sigmoid colon and in the descending colon. One 3 mm polyp in the ascending colon, removed,Resected and retrieved. Two 4 to 7 mm polyps in the transverse colon, removed,Resected and retrieved (both tubular adenomas without high grade dysplasia). The examination was otherwise normal.   DILATION AND CURETTAGE OF UTERUS     ESOPHAGOGASTRODUODENOSCOPY (EGD) WITH PROPOFOL N/A 08/22/2020   carver, benign appearing esophageal stenosis s/p dilation, gastritis, s/p biopsy, portal hypertensive gastropathy, normal duodenum.   POLYPECTOMY  01/23/2021   Procedure: POLYPECTOMY;  Surgeon: CEloise Harman DO;  [facility];  Service: Endoscopy;;   TUBAL LIGATION      Current Outpatient Medications  Medication Sig Dispense Refill   acetaminophen (TYLENOL) 650 MG CR tablet Take 1,300 mg by mouth as needed for pain.     albuterol (VENTOLIN HFA) 108 (90 Base) MCG/ACT inhaler Inhale 2 puffs into the lungs every 6 (six) hours as needed for  wheezing or shortness of breath. 8 g 2   ALPRAZolam (XANAX) 1 MG tablet Take 1 mg by mouth 2 (two) times  daily.     atorvastatin (LIPITOR) 10 MG tablet Take 10 mg by mouth at bedtime.     cyanocobalamin 2000 MCG tablet Take 500 mcg by mouth at bedtime.     cyclobenzaprine (FLEXERIL) 10 MG tablet Take 10 mg by mouth 2 times daily at 12 noon and 4 pm.     diphenhydrAMINE (BENADRYL) 25 MG tablet Take 25 mg by mouth at bedtime as needed for sleep.     furosemide (LASIX) 20 MG tablet Take 1 tablet (20 mg total) by mouth daily as needed. (Patient taking differently: Take 20 mg by mouth daily as needed for edema.) 30 tablet 0   gabapentin (NEURONTIN) 300 MG capsule Take 600 mg by mouth 3 (three) times daily.     pantoprazole (PROTONIX) 40 MG tablet TAKE 1 TABLET BY MOUTH EVERY DAY 90 tablet 1   Vitamin D, Ergocalciferol, (DRISDOL) 1.25 MG (50000 UNIT) CAPS capsule Take 50,000 Units by mouth every Tuesday.     No current facility-administered medications for this visit.    Allergies as of 05/23/2021   (No Known Allergies)    Family History  Problem Relation Age of Onset   Rectal cancer Father 38    Social History   Socioeconomic History   Marital status: Widowed    Spouse name: Not on file   Number of children: Not on file   Years of education: Not on file   Highest education level: Not on file  Occupational History   Not on file  Tobacco Use   Smoking status: Every Day    Packs/day: 1.00    Years: 40.00    Pack years: 40.00    Types: Cigarettes   Smokeless tobacco: Never   Tobacco comments:    smoking .75 pack daily-AH/01/01/2021  Vaping Use   Vaping Use: Never used  Substance and Sexual Activity   Alcohol use: Yes    Comment: Drinks 1 glass bourbon, has been doing that "forever"   Drug use: Never   Sexual activity: Not Currently  Other Topics Concern   Not on file  Social History Narrative   Not on file   Social Determinants of Health   Financial Resource Strain: Not on file  Food Insecurity: Not on file  Transportation Needs: Not on file  Physical Activity: Not on  file  Stress: Not on file  Social Connections: Not on file    Review of Systems: Gen: Denies fever, chills, anorexia. Denies fatigue, weakness, weight loss.  CV: Denies chest pain, palpitations, syncope, peripheral edema, and claudication. Resp: Denies dyspnea at rest, cough, wheezing, coughing up blood, and pleurisy. GI: Denies vomiting blood, jaundice, and fecal incontinence. Denies odynophagia. Endorses dysphagia. Derm: Denies rash, itching, dry skin Psych: Denies depression, anxiety, memory loss, confusion. No homicidal or suicidal ideation.  Heme: Denies bruising, bleeding, and enlarged lymph nodes.  Physical Exam: BP 138/87   Pulse 99   Temp 98.2 F (36.8 C) (Temporal)   Ht 5' 3"  (1.6 m)   Wt 208 lb 6.4 oz (94.5 kg)   BMI 36.92 kg/m  General:   Alert and oriented. No distress noted. Pleasant and cooperative.  Heart: Normal rate and rhythm, s1 and s2 heart sounds present.  Lungs: Clear lung sounds in all lobes. Respirations equal and unlabored. Abdomen:  +BS, soft, non-tender and non-distended. No rebound or guarding. No HSM  or masses noted. Derm: No palmar erythema or jaundice Msk:  Symmetrical without gross deformities. Normal posture. Extremities:  Without edema. Neurologic:  Alert and  oriented x4 Psych:  Alert and cooperative. Normal mood and affect.  ASSESSMENT: Roshonda Sperl is a 60 y.o. female presenting today with history of GERD, dysphagia and portal hypertensive gastropathy secondary to NASH   Dysphagia has improved since esophageal dilation, known esophageal motility disorder. She does try to chew more thoroughly and eat softer texture foods which helps prevent episodes of choking most of the time. She reports episodes of dysphagia occur maybe 1-2x per week still. We discussed chewing precautions, avoiding thick, coarse foods such as meats and taking sips of liquids between bites.  GERD:  Protonix 59m once daily. Denies burning in chest or throat. She does  endorse excessive belching at times, however, no pain associated with belching. She denies sore throat, cough or hoarse voice. Discussed diaphragmatic breathing to prevent intake of excessive air while eating as well as otc gas eliminators such as gas-ex or beano.  NASH: History of abnormal Liver on [place]22 showed hyperechoic coarsened hepatic echotexture suggestive of cirrhosis, however liver elastography (12/05/20)  WNL (2.5) and LFTs WNL (11/2020). Liver biopsy ordered at last GI visit revealed liver fibrosis stage 1A of 4. States that she does drink bourbon, usually buys 1/2 gallon that it takes her 2-3 months. Unsure how much she drinks in one sitting as she does not drink everyday, mostly when she is stressed. Discussed thoroughly what this means as well as importance of alcohol cessation to avoid progression of NASH to cirrhosis which patient verbalized understanding of. Will continue to trend LFTs.   No red flag symptoms. Patient denies melena, hematochezia, nausea, vomiting, diarrhea, constipation, odyonophagia, early satiety or weight loss.    PLAN:  Continue Protonix 491mdaily with good result 2. Implement Chewing precautions to avoid dysphagia/choking 3. Cessation of alcohol due to NASH 4. Try diaphragmatic breathing or otc gas-ex, beano for belching 5. Will continue to trend LFTs   Follow Up: 6 months  Chelsea L. CaAlver SorrowMSN, APRN, AGNP-C Adult-Gerontology Nurse Practitioner [facility]  Addendum: patient seen in conjunction with ChScherrie GerlachMSN, APRN, AGNP-C. AnAnnitta NeedsPhD, ANP-BC [facility]

## 2021-05-23 NOTE — Patient Instructions (Signed)
We will continue to monitor your liver, as we discussed you have some stiffening of the liver. Please avoid alcohol, as this can cause progression to cirrhosis of your liver.  I have provided information about the diaphragmatic breathing we talked about for excessive belching, you can also try gas-ex or beano to see if these help.   Follow up in 6 months.

## 2021-07-24 ENCOUNTER — Encounter: Payer: Self-pay | Admitting: Cardiology

## 2021-07-24 NOTE — Progress Notes (Signed)
Cardiology Office Note  Date: 07/25/2021   ID: Lydia Alexander, DOB 06/29/1961, MRN [id number]  PCP:  Bridget Hartshorn, NP  Cardiologist:  Rozann Lesches, MD Electrophysiologist:  None   Chief Complaint  Patient presents with   Leg Swelling     History of Present Illness: Lydia Alexander is a 60 y.o. female referred for cardiology consultation by Ms. Hemberg NP for the evaluation of leg edema.  She states that she has been having trouble with recurrent, bilateral leg edema over the last few months at least, was seen in the ER in April for similar symptoms.  She does not report any obvious precipitant.  Seems to be worse by the end of the day, sometimes occurs more suddenly.  No definite claudication or ulcerations described.  She was evaluated for DVT with reassuring lower extremity venous Dopplers, also started on as needed Lasix which has been helpful.  She states that she only uses this diuretic once or twice a month, already limiting salt in her diet.  No associated orthopnea or PND, no exertional chest pain.  She has pending evaluation with a vascular specialist arranged by her PCP.  Reports no history of known cardiomyopathy.  I reviewed her medications which are noted below.  She states that she struggles with chronic pain related to spinal stenosis.  Previously worked at the [employer] in [place].  She has atherosclerosis documented by CT imaging, currently on statin therapy per PCP.  I personally reviewed her ECG today which shows sinus rhythm with nonspecific ST-T changes.  Past Medical History:  Diagnosis Date   Anxiety    Back pain    COPD (chronic obstructive pulmonary disease) (HCC)    GERD (gastroesophageal reflux disease)    Mixed hyperlipidemia    Spinal stenosis     Past Surgical History:  Procedure Laterality Date   BALLOON DILATION N/A 08/22/2020   Procedure: BALLOON DILATION;  Surgeon: Eloise Harman, DO;  [facility];  Service:  Endoscopy;  Laterality: N/A;   BIOPSY  08/22/2020   Procedure: BIOPSY;  Surgeon: Eloise Harman, DO;  [facility];  Service: Endoscopy;;   CHOLECYSTECTOMY     COLONOSCOPY WITH PROPOFOL N/A 01/23/2021   carver, Non-bleeding internal hemorrhoids. Diverticulosis in the sigmoid colon and in the descending colon. One 3 mm polyp in the ascending colon, removed,Resected and retrieved. Two 4 to 7 mm polyps in the transverse colon, removed,Resected and retrieved (both tubular adenomas without high grade dysplasia). The examination was otherwise normal.   DILATION AND CURETTAGE OF UTERUS     ESOPHAGOGASTRODUODENOSCOPY (EGD) WITH PROPOFOL N/A 08/22/2020   carver, benign appearing esophageal stenosis s/p dilation, gastritis, s/p biopsy, portal hypertensive gastropathy, normal duodenum.   POLYPECTOMY  01/23/2021   Procedure: POLYPECTOMY;  Surgeon: Eloise Harman, DO;  [facility];  Service: Endoscopy;;   TUBAL LIGATION      Current Outpatient Medications  Medication Sig Dispense Refill   acetaminophen (TYLENOL) 650 MG CR tablet Take 1,300 mg by mouth as needed for pain.     albuterol (VENTOLIN HFA) 108 (90 Base) MCG/ACT inhaler Inhale 2 puffs into the lungs every 6 (six) hours as needed for wheezing or shortness of breath. 8 g 2   ALPRAZolam (XANAX) 1 MG tablet Take 1 mg by mouth 2 (two) times daily.     ALPRAZolam (XANAX) 1 MG tablet Take 1 tablet by mouth 2 (two) times daily.     atorvastatin (LIPITOR) 10  MG tablet Take 10 mg by mouth at bedtime.     atorvastatin (LIPITOR) 20 MG tablet TAKE ONE TABLET (20 MG DOSE) BY MOUTH AT BEDTIME     cyanocobalamin 2000 MCG tablet Take 500 mcg by mouth at bedtime.     cyclobenzaprine (FLEXERIL) 10 MG tablet Take 10 mg by mouth 2 times daily at 12 noon and 4 pm.     diphenhydrAMINE (BENADRYL) 25 MG tablet Take 25 mg by mouth at bedtime as needed for sleep.     diphenhydrAMINE (SOMINEX) 25 MG tablet Take by mouth.     furosemide (LASIX)  20 MG tablet Take 1 tablet (20 mg total) by mouth daily as needed. (Patient taking differently: Take 20 mg by mouth daily as needed for edema.) 30 tablet 0   gabapentin (NEURONTIN) 300 MG capsule Take 600 mg by mouth 3 (three) times daily.     gabapentin (NEURONTIN) 600 MG tablet gabapentin 600 mg tablet  Take 1 tablet 3 times a day by oral route.     nystatin cream (MYCOSTATIN) APPLY TO AFFECTED AREA TWICE A DAY     pantoprazole (PROTONIX) 40 MG tablet TAKE 1 TABLET BY MOUTH EVERY DAY 90 tablet 1   pantoprazole (PROTONIX) 40 MG tablet Take 1 tablet by mouth every morning.     potassium chloride (KLOR-CON) 10 MEQ tablet Take 10 mEq by mouth daily as needed.     Tiotropium Bromide Monohydrate (SPIRIVA RESPIMAT) 2.5 MCG/ACT AERS Inhale into the lungs.     Vitamin D, Ergocalciferol, (DRISDOL) 1.25 MG (50000 UNIT) CAPS capsule Take 50,000 Units by mouth every Tuesday.     No current facility-administered medications for this visit.   Allergies:  Patient has no known allergies.   Social History: The patient  reports that she has been smoking cigarettes. She has a 40.00 pack-year smoking history. She has never used smokeless tobacco. She reports current alcohol use. She reports that she does not use drugs.   Family History: The patient's family history includes Rectal cancer (age of onset: 62) in her father.   ROS: No orthopnea or PND.  No syncope.  Physical Exam: VS:  BP (!) 142/84   Pulse 90   Ht 5' 3"  (1.6 m)   Wt 210 lb (95.3 kg)   SpO2 94%   BMI 37.20 kg/m , BMI Body mass index is 37.2 kg/m.  Wt Readings from Last 3 Encounters:  07/25/21 210 lb (95.3 kg)  05/23/21 208 lb 6.4 oz (94.5 kg)  03/09/21 212 lb (96.2 kg)    General: Patient appears comfortable at rest. HEENT: Conjunctiva and lids normal, wearing a mask. Neck: Supple, no elevated JVP or carotid bruits, no thyromegaly. Lungs: Clear to auscultation, nonlabored breathing at rest. Cardiac: Regular rate and rhythm, no S3 or  significant systolic murmur, no pericardial rub. Abdomen: Soft, nontender, bowel sounds present. Extremities: Trace ankle edema. Skin: Warm and dry.  No ulcerations distally. Musculoskeletal: No kyphosis. Neuropsychiatric: Alert and oriented x3, affect grossly appropriate.  ECG:  An ECG dated 08/02/2020 was personally reviewed today and demonstrated:  Sinus rhythm.  Recent Labwork: 12/05/2020: ALT 16; AST 12 02/09/2021: BUN 9; Creatinine, Ser 0.82; Potassium 4.0; Sodium 137 03/09/2021: Hemoglobin 13.1; Platelets 188     Component Value Date/Time   TRIG 77 08/02/2020 1330  July 2022: Hemoglobin 13.3, platelets 212, BUN 11, creatinine 0.8, potassium 3.8, AST 16, ALT 16, cholesterol 238, triglycerides 159, HDL 73, LDL 137  Other Studies Reviewed Today:  Chest CT 04/02/2021:  IMPRESSION: 1. Slight enlargement of nodular area of architectural distortion in the inferior segment of the lingula technically categorized as Lung-RADS 4BS, suspicious. However, this region is a very common location for areas of progressive post infectious or inflammatory scarring. Further evaluation with nonemergent PET-CT is suggested to better evaluate this finding. 2. The "S" modifier above refers to potentially clinically significant non lung cancer related findings. Specifically, there is aortic atherosclerosis, in addition to left anterior descending coronary artery disease. Please note that although the presence of coronary artery calcium documents the presence of coronary artery disease, the severity of this disease and any potential stenosis cannot be assessed on this non-gated CT examination. Assessment for potential risk factor modification, dietary therapy or pharmacologic therapy may be warranted, if clinically indicated. 3. Mild diffuse bronchial wall thickening with mild centrilobular and paraseptal emphysema. 4. Hepatic steatosis.   Aortic Atherosclerosis (ICD10-I70.0) and Emphysema  (ICD10-J43.9).  Venous Doppler 05/08/2021: IMPRESSION: No evidence of deep venous thrombosis in either lower extremity.  Assessment and Plan:  1.  Intermittent, symmetrical lower leg edema, currently symptomatically improved with as needed use of Lasix and potassium supplement.  No significant weight change.  No evidence of DVT by previous venous Dopplers and referral pending with vascular specialist.  We will obtain an echocardiogram to ensure structurally normal heart.  She is already limiting salt in her diet.  2.  Atherosclerosis by CT imaging, currently on Lipitor with follow-up by PCP.  Medication Adjustments/Labs and Tests Ordered: Current medicines are reviewed at length with the patient today.  Concerns regarding medicines are outlined above.   Tests Ordered: Orders Placed This Encounter  Procedures   EKG 12-Lead   ECHOCARDIOGRAM COMPLETE     Medication Changes: No orders of the defined types were placed in this encounter.   Disposition:  Follow up  test results.  Signed, Satira Sark, MD, [facility] 07/25/2021 1:44 PM    [facility] [address], [place], [place] [address] Phone: [phone]; Fax: [phone]

## 2021-07-25 ENCOUNTER — Ambulatory Visit: Payer: Medicare Other | Admitting: Cardiology

## 2021-07-25 ENCOUNTER — Other Ambulatory Visit: Payer: Self-pay

## 2021-07-25 ENCOUNTER — Encounter: Payer: Self-pay | Admitting: Cardiology

## 2021-07-25 VITALS — BP 142/84 | HR 90 | Ht 63.0 in | Wt 210.0 lb

## 2021-07-25 DIAGNOSIS — I7090 Unspecified atherosclerosis: Secondary | ICD-10-CM | POA: Diagnosis not present

## 2021-07-25 DIAGNOSIS — R600 Localized edema: Secondary | ICD-10-CM | POA: Diagnosis not present

## 2021-07-25 DIAGNOSIS — R609 Edema, unspecified: Secondary | ICD-10-CM

## 2021-07-25 NOTE — Patient Instructions (Addendum)
Medication Instructions:  Your physician recommends that you continue on your current medications as directed. Please refer to the Current Medication list given to you today.  *If you need a refill on your cardiac medications before your next appointment, please call your pharmacy*   Lab Work: None today  If you have labs (blood work) drawn today and your tests are completely normal, you will receive your results only by: [place] (if you have MyChart) OR A paper copy in the mail If you have any lab test that is abnormal or we need to change your treatment, we will call you to review the results.   Testing/Procedures: Your physician has requested that you have an echocardiogram. Echocardiography is a painless test that uses sound waves to create images of your heart. It provides your doctor with information about the size and shape of your heart and how well your heart's chambers and valves are working. This procedure takes approximately one hour. There are no restrictions for this procedure.     Follow-Up: At [facility], you and your health needs are our priority.  As part of our continuing mission to provide you with exceptional heart care, we have created designated Provider Care Teams.  These Care Teams include your primary Cardiologist (physician) and Advanced Practice Providers (APPs -  Physician Assistants and Nurse Practitioners) who all work together to provide you with the care you need, when you need it.  We recommend signing up for the patient portal called "MyChart".  Sign up information is provided on this After Visit Summary.  MyChart is used to connect with patients for Virtual Visits (Telemedicine).  Patients are able to view lab/test results, encounter notes, upcoming appointments, etc.  Non-urgent messages can be sent to your provider as well.   To learn more about what you can do with MyChart, go to [email or web address].    Your next appointment:  We will  call you with test results.        Thank you for choosing [facility] !

## 2021-08-01 ENCOUNTER — Ambulatory Visit (HOSPITAL_COMMUNITY)
Admission: RE | Admit: 2021-08-01 | Discharge: 2021-08-01 | Disposition: A | Discharge status: Home / Self Care | Payer: Medicare Other | Source: Ambulatory Visit | Attending: Cardiology | Admitting: Cardiology

## 2021-08-01 ENCOUNTER — Other Ambulatory Visit: Payer: Self-pay

## 2021-08-01 DIAGNOSIS — I700 Atherosclerosis of aorta: Secondary | ICD-10-CM | POA: Diagnosis not present

## 2021-08-01 DIAGNOSIS — R600 Localized edema: Secondary | ICD-10-CM

## 2021-08-01 DIAGNOSIS — R609 Edema, unspecified: Secondary | ICD-10-CM | POA: Diagnosis not present

## 2021-08-01 LAB — ECHOCARDIOGRAM COMPLETE
AR max vel: 2.37 cm2
AV Area VTI: 2.18 cm2
AV Area mean vel: 1.99 cm2
AV Mean grad: 6 mmHg
AV Peak grad: 10.6 mmHg
Ao pk vel: 1.63 m/s
Area-P 1/2: 4.12 cm2
Calc EF: 59.6 %
MV VTI: 3.28 cm2
S' Lateral: 3 cm
Single Plane A2C EF: 64.7 %
Single Plane A4C EF: 54.6 %

## 2021-08-01 NOTE — Progress Notes (Signed)
*  PRELIMINARY RESULTS* Echocardiogram 2D Echocardiogram has been performed.  Lydia Alexander 08/01/2021, 1:54 PM

## 2021-08-08 ENCOUNTER — Telehealth: Payer: Self-pay

## 2021-08-08 NOTE — Telephone Encounter (Signed)
NOTES SCANNED TO REFERRAL RJ 

## 2021-08-10 ENCOUNTER — Telehealth: Payer: Self-pay | Admitting: Pulmonary Disease

## 2021-08-10 DIAGNOSIS — R0683 Snoring: Secondary | ICD-10-CM

## 2021-08-10 NOTE — Telephone Encounter (Signed)
When I called the patient to schedule her LCS CT 3 month follow up she was telling me about her not using her 02 at night sometimes.  She was wondering if an order can be placed for her to have an ONO done on room air to see is she still needs the 02 at night

## 2021-08-10 NOTE — Telephone Encounter (Signed)
Ok to check ONO on room air to determine if she still needs supplemental oxygen.   Her sleep study was done 7 months ago which indicated good oxygen levels with 1L of O2.   Thanks, Wille Glaser

## 2021-08-10 NOTE — Telephone Encounter (Signed)
Call made to patient, confirmed DOB. She reports she goes several nights without using her oxygen. She was trying to see if it made a difference during the day. I made her aware per the sleep study her oxygen saturations were dropping however she does not have sleep apnea. I asked if she was checking her oxygen at night, she states notes. After further questions I was able to determine that she does have a pulse ox. I encouraged her to wear the oxygen and on the nights she does not be sure to record her oxygen saturations so she can have something to report. She states she does not feel much of a difference and did not think she needed it any longer.   F/U appt made for 11/18.   JD please advise patient requesting ONO on room air to determine if she still needs oxygen at night and any other recommendations. Thanks :)

## 2021-08-13 NOTE — Telephone Encounter (Signed)
ONO order has been placed per JD.  Nothing further is needed.

## 2021-08-13 NOTE — Telephone Encounter (Signed)
Yes, it is ok to check an ONO on room air for her.   Lydia Alexander

## 2021-08-15 ENCOUNTER — Other Ambulatory Visit: Payer: Self-pay | Admitting: Gastroenterology

## 2021-08-15 DIAGNOSIS — K3189 Other diseases of stomach and duodenum: Secondary | ICD-10-CM

## 2021-08-15 DIAGNOSIS — R1319 Other dysphagia: Secondary | ICD-10-CM

## 2021-08-15 DIAGNOSIS — K766 Portal hypertension: Secondary | ICD-10-CM

## 2021-09-06 ENCOUNTER — Ambulatory Visit (HOSPITAL_COMMUNITY)
Admission: RE | Admit: 2021-09-06 | Discharge: 2021-09-06 | Disposition: A | Discharge status: Home / Self Care | Payer: Medicare Other | Source: Ambulatory Visit | Attending: Acute Care | Admitting: Acute Care

## 2021-09-06 ENCOUNTER — Other Ambulatory Visit: Payer: Self-pay

## 2021-09-06 DIAGNOSIS — F17210 Nicotine dependence, cigarettes, uncomplicated: Secondary | ICD-10-CM | POA: Diagnosis present

## 2021-09-06 DIAGNOSIS — Z87891 Personal history of nicotine dependence: Secondary | ICD-10-CM | POA: Insufficient documentation

## 2021-09-18 ENCOUNTER — Telehealth: Payer: Self-pay | Admitting: Pulmonary Disease

## 2021-09-18 NOTE — Telephone Encounter (Signed)
Attempted to call the pt but the call would not go through.  Will try back later.

## 2021-09-18 NOTE — Telephone Encounter (Signed)
Please let patient know she still qualifies for nocturnal oxygen therapy based on her ONO from 08/23/21.  I recommend she continue to use 2 L of oxygen at night when sleeping.  Thanks,  Wille Glaser

## 2021-09-19 ENCOUNTER — Other Ambulatory Visit: Payer: Self-pay | Admitting: Acute Care

## 2021-09-19 DIAGNOSIS — F17210 Nicotine dependence, cigarettes, uncomplicated: Secondary | ICD-10-CM

## 2021-09-19 NOTE — Telephone Encounter (Signed)
LMTCB

## 2021-09-27 NOTE — Progress Notes (Signed)
Synopsis: Referred in 11/2020 for evaluation of COPD and pneumonia by Lars Mage, NP.  Subjective:   PATIENT ID: Lydia Alexander GENDER: female DOB: 1961-04-21, MRN: [id number]  HPI  Chief Complaint  Patient presents with   Follow-up    F/U on DOE, states she has been doing well since last visit. Has not used her O2 at night in over a month due to the dryness.    Lydia Alexander is a 60 year old woman, daily smoker with dysphagia, GERD, and centrilobular emphysema who returns to pulmonary clinic for follow up.  She has done well since last visit with no issues in her breathing. She reports using the spiriva inhaler 1 time as she did not try it on a regular basis. [place] having shortness of breath and it did provide relief. She has an albuterol inhaler for as needed use.  She continues to smoke 5-15 cigarettes daily. She is enrolled in our lung cancer screening program. Last scan was 09/06/21, Lung RADS 2.   She does have nocturnal oxygen at home but has not been using it over the past month due to complaints of dry nasal passages.   She is accompanied by her son today.   Past Medical History:  Diagnosis Date   Anxiety    Back pain    COPD (chronic obstructive pulmonary disease) (HCC)    GERD (gastroesophageal reflux disease)    Mixed hyperlipidemia    Spinal stenosis      Family History  Problem Relation Age of Onset   Rectal cancer Father 51     Social History   Socioeconomic History   Marital status: Widowed    Spouse name: Not on file   Number of children: Not on file   Years of education: Not on file   Highest education level: Not on file  Occupational History   Not on file  Tobacco Use   Smoking status: Every Day    Packs/day: 1.00    Years: 40.00    Pack years: 40.00    Types: Cigarettes   Smokeless tobacco: Never   Tobacco comments:    smoking .75 pack daily-AH/01/01/2021  Vaping Use   Vaping Use: Never used  Substance and  Sexual Activity   Alcohol use: Yes   Drug use: Never   Sexual activity: Not Currently  Other Topics Concern   Not on file  Social History Narrative   Not on file   Social Determinants of Health   Financial Resource Strain: Not on file  Food Insecurity: Not on file  Transportation Needs: Not on file  Physical Activity: Not on file  Stress: Not on file  Social Connections: Not on file  Intimate Partner Violence: Not on file     No Known Allergies   Outpatient Medications Prior to Visit  Medication Sig Dispense Refill   acetaminophen (TYLENOL) 650 MG CR tablet Take 1,300 mg by mouth as needed for pain.     albuterol (VENTOLIN HFA) 108 (90 Base) MCG/ACT inhaler Inhale 2 puffs into the lungs every 6 (six) hours as needed for wheezing or shortness of breath. 8 g 2   ALPRAZolam (XANAX) 1 MG tablet Take 1 tablet by mouth 2 (two) times daily.     atorvastatin (LIPITOR) 10 MG tablet Take 10 mg by mouth at bedtime.     cyanocobalamin 2000 MCG tablet Take 500 mcg by mouth at bedtime.     cyclobenzaprine (FLEXERIL) 10 MG tablet  Take 10 mg by mouth 2 times daily at 12 noon and 4 pm.     diphenhydrAMINE (BENADRYL) 25 MG tablet Take 25 mg by mouth at bedtime as needed for sleep.     furosemide (LASIX) 20 MG tablet Take 1 tablet (20 mg total) by mouth daily as needed. (Patient taking differently: Take 20 mg by mouth daily as needed for edema.) 30 tablet 0   gabapentin (NEURONTIN) 600 MG tablet gabapentin 600 mg tablet  Take 1 tablet 3 times a day by oral route.     nystatin cream (MYCOSTATIN) APPLY TO AFFECTED AREA TWICE A DAY     pantoprazole (PROTONIX) 40 MG tablet TAKE 1 TABLET BY MOUTH EVERY DAY 90 tablet 1   potassium chloride (KLOR-CON) 10 MEQ tablet Take 10 mEq by mouth daily as needed.     Tiotropium Bromide Monohydrate (SPIRIVA RESPIMAT) 2.5 MCG/ACT AERS Inhale into the lungs.     Vitamin D, Ergocalciferol, (DRISDOL) 1.25 MG (50000 UNIT) CAPS capsule Take 50,000 Units by mouth every  Tuesday.     ALPRAZolam (XANAX) 1 MG tablet Take 1 mg by mouth 2 (two) times daily.     atorvastatin (LIPITOR) 20 MG tablet TAKE ONE TABLET (20 MG DOSE) BY MOUTH AT BEDTIME     diphenhydrAMINE (SOMINEX) 25 MG tablet Take by mouth.     gabapentin (NEURONTIN) 300 MG capsule Take 600 mg by mouth 3 (three) times daily.     No facility-administered medications prior to visit.    Review of Systems  Constitutional:  Negative for chills, fever, malaise/fatigue and weight loss.  HENT:  Negative for congestion, sinus pain and sore throat.   Eyes: Negative.   Respiratory:  Positive for shortness of breath (with exertion). Negative for cough, hemoptysis, sputum production and wheezing.   Cardiovascular:  Negative for chest pain, palpitations, orthopnea, claudication and leg swelling.  Gastrointestinal:  Negative for abdominal pain, heartburn, nausea and vomiting.  Genitourinary: Negative.   Musculoskeletal: Negative.   Skin:  Negative for rash.  Neurological:  Negative for dizziness, weakness and headaches.  Endo/Heme/Allergies: Negative.   Psychiatric/Behavioral: Negative.     Objective:   Vitals:   09/28/21 0959  BP: 124/72  Pulse: 84  SpO2: 97%  Weight: 216 lb 6.4 oz (98.2 kg)  Height: 5' 3"  (1.6 m)    Physical Exam Constitutional:      General: She is not in acute distress.    Appearance: She is obese.  HENT:     Head: Normocephalic and atraumatic.     Mouth/Throat:     Mouth: Mucous membranes are moist.     Pharynx: Oropharynx is clear.  Eyes:     General: No scleral icterus.    Conjunctiva/sclera: Conjunctivae normal.     Pupils: Pupils are equal, round, and reactive to light.  Cardiovascular:     Rate and Rhythm: Normal rate and regular rhythm.     Pulses: Normal pulses.     Heart sounds: Normal heart sounds. No murmur heard. Pulmonary:     Effort: Pulmonary effort is normal.     Breath sounds: Decreased air movement present. No wheezing, rhonchi or rales.  Abdominal:      General: Bowel sounds are normal.     Palpations: Abdomen is soft.  Musculoskeletal:     Right lower leg: No edema.     Left lower leg: No edema.  Skin:    General: Skin is warm and dry.     Capillary Refill: Capillary refill  takes less than 2 seconds.  Neurological:     General: No focal deficit present.     Mental Status: She is alert.    CBC    Component Value Date/Time   WBC 6.8 03/09/2021 1131   RBC 3.99 03/09/2021 1131   HGB 13.1 03/09/2021 1131   HCT 42.4 03/09/2021 1131   PLT 188 03/09/2021 1131   MCV 106.3 (H) 03/09/2021 1131   MCH 32.8 03/09/2021 1131   MCHC 30.9 03/09/2021 1131   RDW 13.4 03/09/2021 1131   LYMPHSABS 2.0 02/09/2021 0028   MONOABS 0.6 02/09/2021 0028   EOSABS 0.1 02/09/2021 0028   BASOSABS 0.1 02/09/2021 0028   BMP Latest Ref Rng & Units 02/09/2021 12/05/2020 08/03/2020  Glucose 70 - 99 mg/dL 108(H) 129(H) 109(H)  BUN 6 - 20 mg/dL 9 15 10   Creatinine 0.44 - 1.00 mg/dL 0.82 0.73 0.62  Sodium 135 - 145 mmol/L 137 139 140  Potassium 3.5 - 5.1 mmol/L 4.0 3.4(L) 4.1  Chloride 98 - 111 mmol/L 102 103 105  CO2 22 - 32 mmol/L 27 28 29   Calcium 8.9 - 10.3 mg/dL 8.5(L) 8.7(L) 8.1(L)   Chest imaging: CT Chest LCS 09/06/21 1. Lung-RADS 2, benign appearance or behavior. Continue annual screening with low-dose chest CT without contrast in 12 months. 2. Coronary artery calcifications. 3. Aortic Atherosclerosis  CXR 09/26/20 1. No evidence of acute cardiopulmonary disease. 2. Nodular density along the LEFT heart border of uncertain significance. This could represent scarring or residual airspace disease from reported recent pneumonia. Suggest 4-6 week follow-up to ensure resolution and exclude underlying nodule.  CXR 08/02/20 Prominence of the cardiomediastinal silhouette may be secondary to mild hypoinflation and AP technique. Patchy left basilar opacities. No pneumothorax or pleural effusion. No acute osseous abnormality.  PFT: No flowsheet data  found.  Assessment & Plan:   Centrilobular emphysema ([place]) - Plan: Pulmonary Function Test  Cigarette smoker  Nocturnal hypoxemia - Plan: Ambulatory Referral for DME  Discussion: Lydia Alexander is a 60 year old woman, daily smoker with dysphagia, GERD, and centrilobular emphysema who returns to pulmonary clinic for follow up.    We discussed smoking cessation options with nicotine replacement therapy. Provided her with card for smoking cessation hotlines. Recommended she start with 3m daily nicotine patch and as needed nicotine lozenges.   She is to continue albuterol inhaler as needed for cough, wheezing or dyspnea.   She is to continue 2L supplemental oxygen at night. We will order her humidifier attachment. We will also place order to DME company to pickup portable oxygen tanks.   We will check pulmonary function tests at follow up visit.   She will continue lung cancer screening.  Follow up in 6 months.  JFreda Jackson MD [facility] Office: [phone]  Current Outpatient Medications:    acetaminophen (TYLENOL) 650 MG CR tablet, Take 1,300 mg by mouth as needed for pain., Disp: , Rfl:    albuterol (VENTOLIN HFA) 108 (90 Base) MCG/ACT inhaler, Inhale 2 puffs into the lungs every 6 (six) hours as needed for wheezing or shortness of breath., Disp: 8 g, Rfl: 2   ALPRAZolam (XANAX) 1 MG tablet, Take 1 tablet by mouth 2 (two) times daily., Disp: , Rfl:    atorvastatin (LIPITOR) 10 MG tablet, Take 10 mg by mouth at bedtime., Disp: , Rfl:    cyanocobalamin 2000 MCG tablet, Take 500 mcg by mouth at bedtime., Disp: , Rfl:    cyclobenzaprine (FLEXERIL) 10 MG  tablet, Take 10 mg by mouth 2 times daily at 12 noon and 4 pm., Disp: , Rfl:    diphenhydrAMINE (BENADRYL) 25 MG tablet, Take 25 mg by mouth at bedtime as needed for sleep., Disp: , Rfl:    furosemide (LASIX) 20 MG tablet, Take 1 tablet (20 mg total) by mouth daily as needed. (Patient taking  differently: Take 20 mg by mouth daily as needed for edema.), Disp: 30 tablet, Rfl: 0   gabapentin (NEURONTIN) 600 MG tablet, gabapentin 600 mg tablet  Take 1 tablet 3 times a day by oral route., Disp: , Rfl:    nystatin cream (MYCOSTATIN), APPLY TO AFFECTED AREA TWICE A DAY, Disp: , Rfl:    pantoprazole (PROTONIX) 40 MG tablet, TAKE 1 TABLET BY MOUTH EVERY DAY, Disp: 90 tablet, Rfl: 1   potassium chloride (KLOR-CON) 10 MEQ tablet, Take 10 mEq by mouth daily as needed., Disp: , Rfl:    Tiotropium Bromide Monohydrate (SPIRIVA RESPIMAT) 2.5 MCG/ACT AERS, Inhale into the lungs., Disp: , Rfl:    Vitamin D, Ergocalciferol, (DRISDOL) 1.25 MG (50000 UNIT) CAPS capsule, Take 50,000 Units by mouth every Tuesday., Disp: , Rfl:

## 2021-09-28 ENCOUNTER — Ambulatory Visit: Payer: Medicare Other | Admitting: Pulmonary Disease

## 2021-09-28 ENCOUNTER — Other Ambulatory Visit: Payer: Self-pay

## 2021-09-28 ENCOUNTER — Encounter: Payer: Self-pay | Admitting: Pulmonary Disease

## 2021-09-28 VITALS — BP 124/72 | HR 84 | Ht 63.0 in | Wt 216.4 lb

## 2021-09-28 DIAGNOSIS — G4734 Idiopathic sleep related nonobstructive alveolar hypoventilation: Secondary | ICD-10-CM

## 2021-09-28 DIAGNOSIS — J432 Centrilobular emphysema: Secondary | ICD-10-CM | POA: Diagnosis not present

## 2021-09-28 DIAGNOSIS — F17210 Nicotine dependence, cigarettes, uncomplicated: Secondary | ICD-10-CM | POA: Diagnosis not present

## 2021-09-28 NOTE — Patient Instructions (Addendum)
Use nicotine patch 38m daily to help you quit smoking. When you are comfortable with decreasing the dose you can change to a 714mdaily patch.   Use nicotine mini lozenges as needed for break through cravings for cigarettes.   We will check your oxygen levels while walking and if you do not drop to unsafe levels we will have the oxygen company pick up the tanks.   Use albuterol inhaler as needed for shortness of breath.  We will schedule you for pulmonary function tests at next follow up visit

## 2021-10-10 IMAGING — US US ABDOMEN COMPLETE W/ ELASTOGRAPHY
1 series · 12 of 25 positions shown · non-contrast
Comparison: None.

CLINICAL DATA: Portal hypertensive gastropathy, evaluate for
cirrhosis/portal hypertension

EXAM:
ULTRASOUND ABDOMEN
ULTRASOUND HEPATIC ELASTOGRAPHY
TECHNIQUE: Sonography of the upper abdomen was performed. In addition,
ultrasound elastography evaluation of the liver was performed. A
region of interest was placed within the right lobe of the liver.
Following application of a compressive sonographic pulse, tissue
compressibility was assessed. Multiple assessments were performed at
the selected site. Median tissue compressibility was determined.
Previously, hepatic stiffness was assessed by shear wave velocity.
Based on recently published Society of Radiologists in Ultrasound
consensus article, reporting is now recommended to be performed in
the SI units of pressure (kiloPascals) representing hepatic
stiffness/elasticity. The obtained result is compared to the
published reference standards. (cACLD = compensated Advanced Chronic
Liver Disease)

[Series 1: us abdomen complete w/elastography · 12 of 76 slices shown]
[im 4/76]
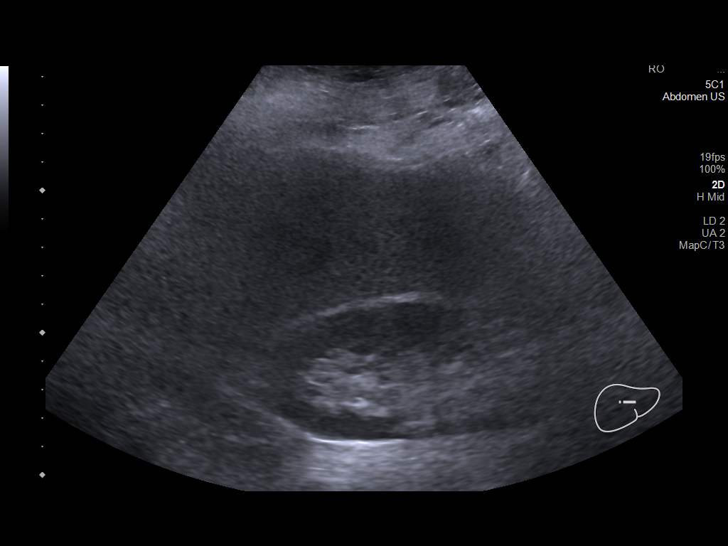
[im 10/76]
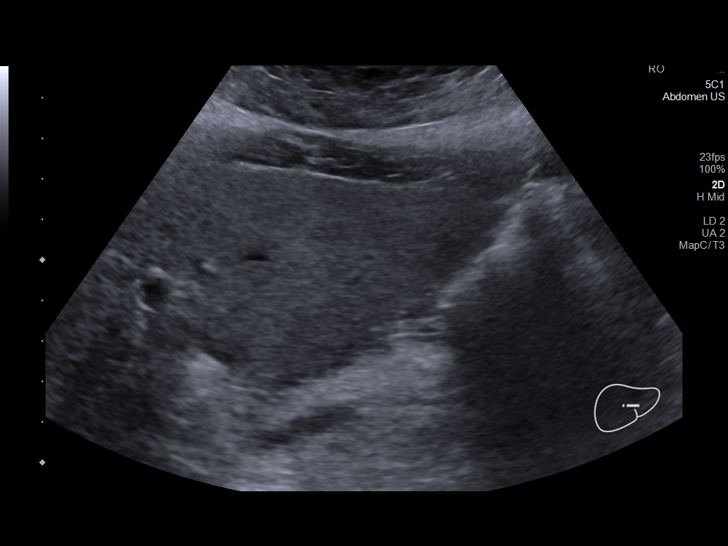
[im 16/76]
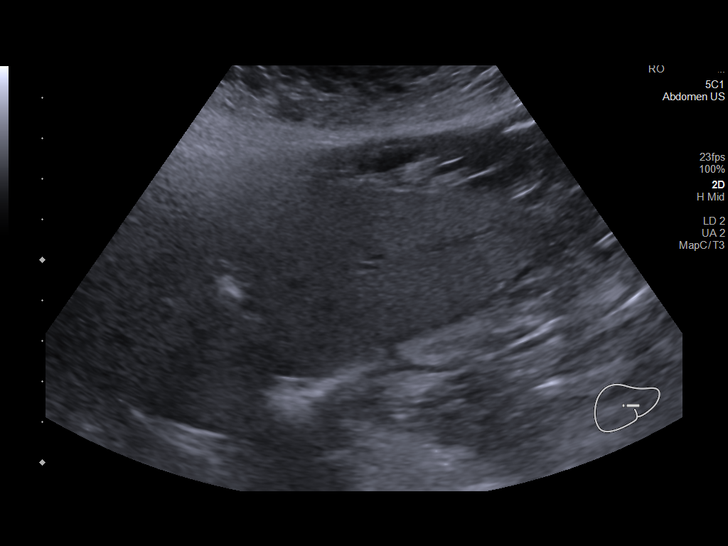
[im 22/76]
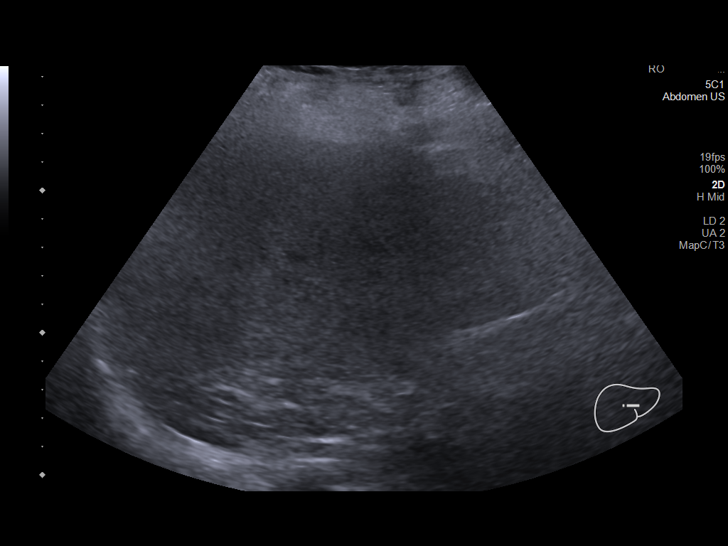
[im 29/76]
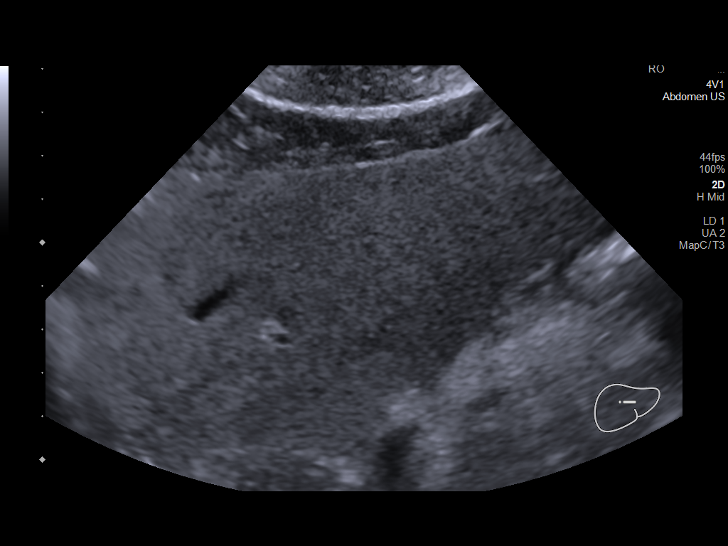
[im 35/76]
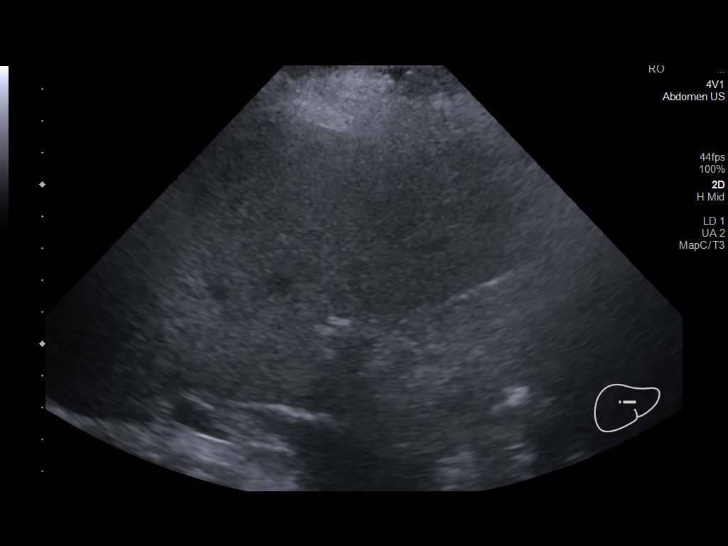
[im 41/76]
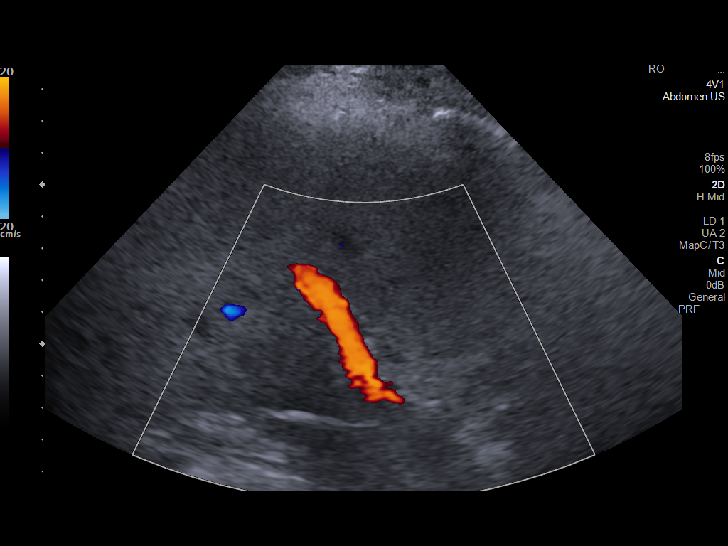
[im 47/76]
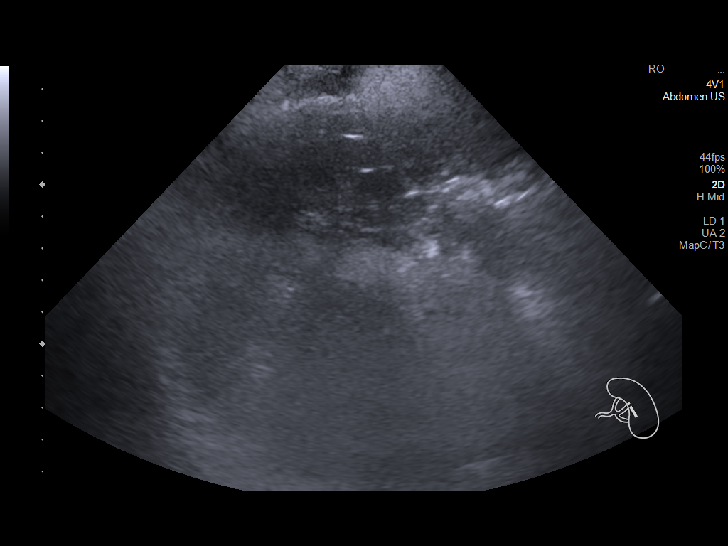
[im 54/76]
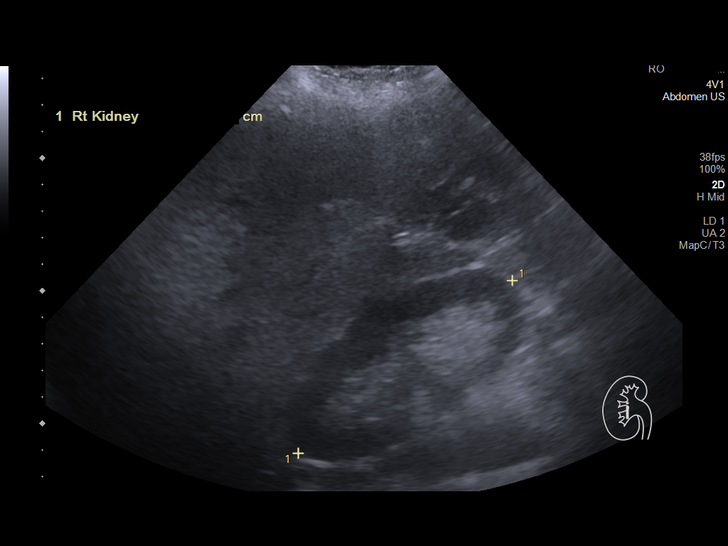
[im 60/76]
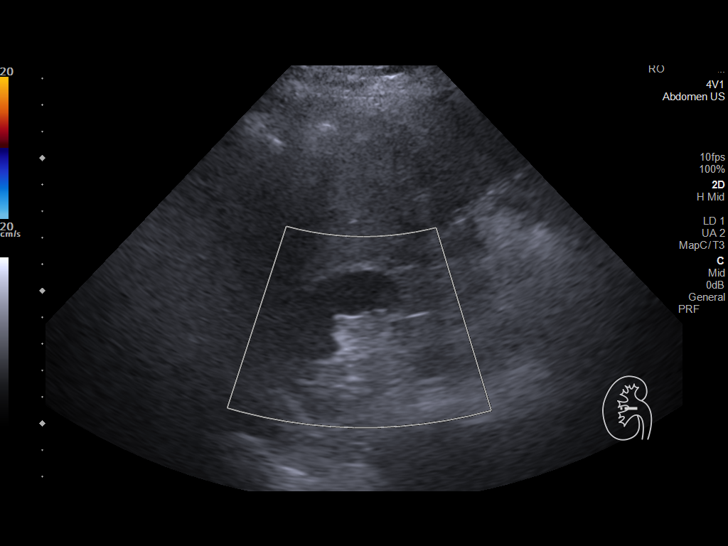
[im 66/76]
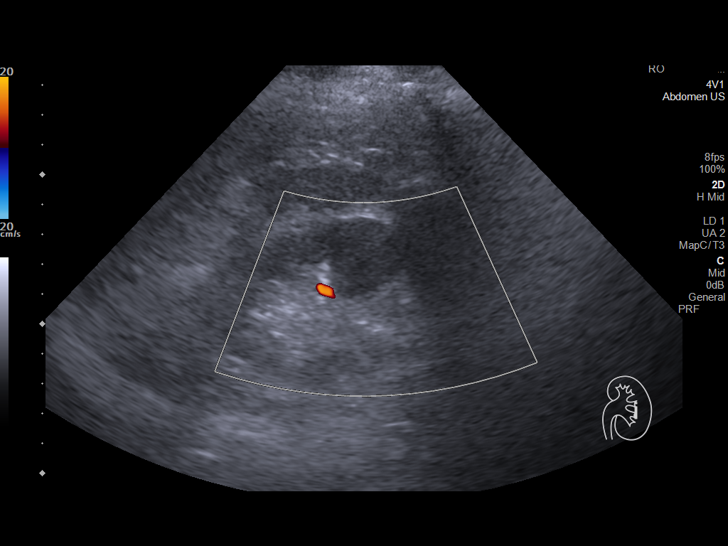
[im 72/76]
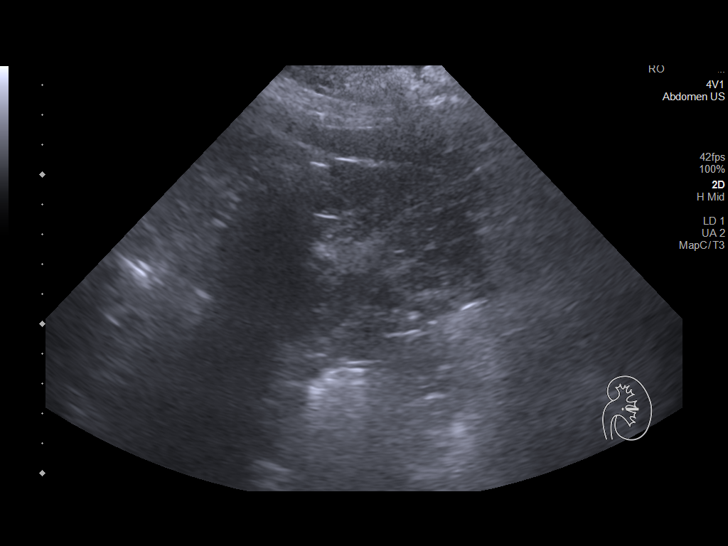

[12 of 25 positions shown; findings below may reference images not displayed]

FINDINGS: ULTRASOUND ABDOMEN

Gallbladder: Surgically absent

Common bile duct: Diameter: 4 mm

Liver: No focal lesion identified. Diffusely increased
heterogeneously coarsened hepatic parenchyma. Portal vein is patent
on color Doppler imaging with normal direction of blood flow towards
the liver.

IVC: No abnormality visualized.

Pancreas: Visualized portion unremarkable.

Spleen: Size and appearance within normal limits (6.4 cm).

Right Kidney: Length: 10.4 cm. Echogenicity within normal limits. No
mass or hydronephrosis visualized.

Left Kidney: Length: 10 cm. Echogenicity within normal limits. No
mass or hydronephrosis visualized.

Abdominal aorta: The mid/distal aorta is obscured by bowel gas, no
aneurysm in the visualized portions of the aorta.

Other findings: None.

ULTRASOUND HEPATIC ELASTOGRAPHY

Device: Siemens Helix VTQ

Patient position: Oblique

Transducer 4V1

Number of measurements: 10

Hepatic segment:  8

Median kPa:

IQR:

IQR/Median kPa ratio:

Data quality:  Good

Diagnostic category:  < or = 5 kPa: high probability of being normal

The use of hepatic elastography is applicable to patients with viral
hepatitis and non-alcoholic fatty liver disease. At this time, there
is insufficient data for the referenced cut-off values and use in
other causes of liver disease, including alcoholic liver disease.
Patients, however, may be assessed by elastography and serve as
their own reference standard/baseline.

In patients with non-alcoholic liver disease, the values suggesting
compensated advanced chronic liver disease (cACLD) may be lower, and
patients may need additional testing with elasticity results of [DATE]
kPa.

Please note that abnormal hepatic elasticity and shear wave
velocities may also be identified in clinical settings other than
with hepatic fibrosis, such as: acute hepatitis, elevated right
heart and central venous pressures including use of beta blockers,
Lojzek disease ([place]), infiltrative processes such as
mastocytosis/amyloidosis/infiltrative tumor/lymphoma, extrahepatic
cholestasis, with hyperemia in the post-prandial state, and with
liver transplantation. Correlation with patient history, laboratory
data, and clinical condition recommended.

Diagnostic Categories:

< or =5 kPa: high probability of being normal

< or =9 kPa: in the absence of other known clinical signs, rules [DATE] kPa and ?13 kPa: suggestive of cACLD, but needs further testing

>13 kPa: highly suggestive of cACLD

> or =17 kPa: highly suggestive of cACLD with an increased
probability of clinically significant portal hypertension
IMPRESSION: ULTRASOUND ABDOMEN:

Diffusely hyperechoic coarsened hepatic echotexture, suggestive of
cirrhosis, without visualized suspicious hepatic lesion.

ULTRASOUND HEPATIC ELASTOGRAPHY:

Median kPa:

Diagnostic category:  < or = 5 kPa: high probability of being normal

## 2021-11-27 ENCOUNTER — Ambulatory Visit: Payer: Medicare Other | Admitting: Gastroenterology

## 2021-11-27 ENCOUNTER — Other Ambulatory Visit: Payer: Self-pay

## 2021-11-27 ENCOUNTER — Encounter: Payer: Self-pay | Admitting: Gastroenterology

## 2021-11-27 VITALS — BP 157/95 | HR 69 | Temp 97.6°F | Ht 63.0 in | Wt 213.2 lb

## 2021-11-27 DIAGNOSIS — R1319 Other dysphagia: Secondary | ICD-10-CM

## 2021-11-27 DIAGNOSIS — K7581 Nonalcoholic steatohepatitis (NASH): Secondary | ICD-10-CM | POA: Diagnosis not present

## 2021-11-27 DIAGNOSIS — K219 Gastro-esophageal reflux disease without esophagitis: Secondary | ICD-10-CM | POA: Diagnosis not present

## 2021-11-27 DIAGNOSIS — K766 Portal hypertension: Secondary | ICD-10-CM

## 2021-11-27 DIAGNOSIS — K3189 Other diseases of stomach and duodenum: Secondary | ICD-10-CM

## 2021-11-27 MED ORDER — PANTOPRAZOLE SODIUM 40 MG PO TBEC: 40.0000 mg | DELAYED_RELEASE_TABLET | Freq: Two times a day (BID) | ORAL | 5 refills | Status: AC

## 2021-11-27 NOTE — Patient Instructions (Signed)
Please call us if any worsening!  Continue to chew well, eat small bites, and cut up your food.  I have increased pantoprazole (Protonix) to twice a day, 30 minutes before breakfast and dinner.   It is best to avoid alcohol all together to avoid progression of fibrosis to cirrhosis.  We will see you in 3-4 months!  I enjoyed seeing you again today! As you know, I value our relationship and want to provide genuine, compassionate, and quality care. I welcome your feedback. If you receive a survey regarding your visit,  I greatly appreciate you taking time to fill this out. See you next time!  Annitta Needs, PhD, ANP-BC [facility]

## 2021-11-27 NOTE — Progress Notes (Signed)
Referring Provider: Bridget Hartshorn, NP Primary Care Physician:  Bridget Hartshorn, NP Primary GI: Dr .Abbey Chatters   Chief Complaint  Patient presents with   nash    F/u   Dysphagia    Worse with meds/solids, comes/goes at times    HPI:   Lydia Alexander is a 61 y.o. female presenting today with a history of GERD, dysphagia, fatty liver, concern for cirrhosis on prior US but  liver elastography (12/05/20)  WNL (2.5) and LFTs WNL (Oct 2022). Liver biopsy ordered at last GI visit revealed liver fibrosis stage 1A of 4 and changes most likely consistent with steatohepatitis  Surveillance colonoscopy due in 2027 if health permits. Last endoscopy Oct 2021 with stenosis s/p dilation, gastritis, portal gastropathy.   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   Dysphagia has recurred again. If drinking through straw, does ok with liquids. Some solid food dysphagia but better if eats smaller pieces. Pill dysphagia. She wants to hold off on endoscopy now. Protonix once daily. No overt GI bleeding.   Drinks bourbon at night.   Past Medical History:  Diagnosis Date   Anxiety    Back pain    COPD (chronic obstructive pulmonary disease) (HCC)    GERD (gastroesophageal reflux disease)    Mixed hyperlipidemia    Spinal stenosis     Past Surgical History:  Procedure Laterality Date   BALLOON DILATION N/A 08/22/2020   Procedure: BALLOON DILATION;  Surgeon: Eloise Harman, DO;  Location: [facility];  Service: Endoscopy;  Laterality: N/A;   BIOPSY  08/22/2020   Procedure: BIOPSY;  Surgeon: Eloise Harman, DO;  Location: [facility];  Service: Endoscopy;;   CHOLECYSTECTOMY     COLONOSCOPY WITH PROPOFOL N/A 01/23/2021   carver, Non-bleeding internal hemorrhoids. Diverticulosis in the sigmoid colon and in the descending colon. One 3 mm polyp in the ascending colon, removed,Resected and retrieved.  Two 4 to 7 mm polyps in the transverse colon, removed,Resected and retrieved (both tubular adenomas without high grade dysplasia). The examination was otherwise normal. 5 year surveillance   DILATION AND CURETTAGE OF UTERUS     ESOPHAGOGASTRODUODENOSCOPY (EGD) WITH PROPOFOL N/A 08/22/2020   carver, benign appearing esophageal stenosis s/p dilation, gastritis, s/p biopsy, portal hypertensive gastropathy, normal duodenum.   POLYPECTOMY  01/23/2021   Procedure: POLYPECTOMY;  Surgeon: Eloise Harman, DO;  Location: [facility];  Service: Endoscopy;;   TUBAL LIGATION      Current Outpatient Medications  Medication Sig Dispense Refill   acetaminophen (TYLENOL) 650 MG CR tablet Take 1,300 mg by mouth as needed for pain.     albuterol (VENTOLIN HFA) 108 (90 Base) MCG/ACT inhaler Inhale 2 puffs into the lungs every 6 (six) hours as needed for wheezing or shortness of breath. 8 g 2   ALPRAZolam (XANAX) 1 MG tablet Take 1 tablet by mouth 2 (two) times daily.     atorvastatin (LIPITOR) 10 MG tablet Take 10 mg by mouth at bedtime.     buPROPion (WELLBUTRIN) 75 MG tablet Not started yet 11/27/21     cyanocobalamin 2000 MCG tablet Take 500 mcg by mouth at bedtime.     cyclobenzaprine (FLEXERIL) 10 MG tablet Take 10 mg by mouth 2 times daily at 12 noon and 4 pm.     diphenhydrAMINE (BENADRYL) 25 MG tablet Take 25 mg by mouth at bedtime as needed for sleep.  furosemide (LASIX) 20 MG tablet Take 1 tablet (20 mg total) by mouth daily as needed. (Patient taking differently: Take 20 mg by mouth daily as needed for edema.) 30 tablet 0   gabapentin (NEURONTIN) 600 MG tablet gabapentin 600 mg tablet  Take 1 tablet 3 times a day by oral route.     nystatin cream (MYCOSTATIN) APPLY TO AFFECTED AREA TWICE A DAY     pantoprazole (PROTONIX) 40 MG tablet TAKE 1 TABLET BY MOUTH EVERY DAY 90 tablet 1   potassium chloride (KLOR-CON) 10 MEQ tablet Take 10 mEq by mouth daily as needed.     Tiotropium Bromide  Monohydrate (SPIRIVA RESPIMAT) 2.5 MCG/ACT AERS Inhale into the lungs.     Vitamin D, Ergocalciferol, (DRISDOL) 1.25 MG (50000 UNIT) CAPS capsule Take 50,000 Units by mouth every Tuesday.     No current facility-administered medications for this visit.    Allergies as of 11/27/2021   (No Known Allergies)    Family History  Problem Relation Age of Onset   Rectal cancer Father 73    Social History   Socioeconomic History   Marital status: Widowed    Spouse name: Not on file   Number of children: Not on file   Years of education: Not on file   Highest education level: Not on file  Occupational History   Not on file  Tobacco Use   Smoking status: Every Day    Packs/day: 1.00    Years: 40.00    Pack years: 40.00    Types: Cigarettes   Smokeless tobacco: Never   Tobacco comments:    smoking .75 pack daily-AH/01/01/2021  Vaping Use   Vaping Use: Never used  Substance and Sexual Activity   Alcohol use: Yes    Comment: 1-2 shots burbon at night   Drug use: Never   Sexual activity: Not Currently  Other Topics Concern   Not on file  Social History Narrative   Not on file   Social Determinants of Health   Financial Resource Strain: Not on file  Food Insecurity: Not on file  Transportation Needs: Not on file  Physical Activity: Not on file  Stress: Not on file  Social Connections: Not on file    Review of Systems: Gen: Denies fever, chills, anorexia. Denies fatigue, weakness, weight loss.  CV: Denies chest pain, palpitations, syncope, peripheral edema, and claudication. Resp: Denies dyspnea at rest, cough, wheezing, coughing up blood, and pleurisy. GI: see HPI Derm: Denies rash, itching, dry skin Psych: Denies depression, anxiety, memory loss, confusion. No homicidal or suicidal ideation.  Heme: Denies bruising, bleeding, and enlarged lymph nodes.  Physical Exam: BP (!) 157/95    Pulse 69    Temp 97.6 F (36.4 C)    Ht 5' 3"  (1.6 m)    Wt 213 lb 3.2 oz (96.7 kg)     BMI 37.77 kg/m  General:   Alert and oriented. No distress noted. Pleasant and cooperative.  Head:  Normocephalic and atraumatic. Eyes:  Conjuctiva clear without scleral icterus. Mouth:  mask in place Abdomen:  +BS, soft, non-tender and non-distended. No rebound or guarding. No HSM or masses noted. Msk:  Symmetrical without gross deformities. Normal posture. Extremities:  Without edema. Neurologic:  Alert and  oriented x4 Psych:  Alert and cooperative. Normal mood and affect.  ASSESSMENT: Lydia Alexander is a 61 y.o. female presenting today with a history of GERD, dysphagia, fatty liver, concern for cirrhosis on prior US but  liver elastography (12/05/20)  WNL (2.5) and LFTs WNL (Oct 2022). Liver biopsy April 2022 revealed liver fibrosis stage 1A of 4 and changes most likely consistent with steatohepatitis.  Last endoscopy Oct 2021 with stenosis s/p dilation, gastritis, portal gastropathy.    Dysphagia: recurred. Improvement after dilation in 2021. BPE with known motility disorder. She is declining EGD/dilation at this time. She has had some improvement with behavior changes such as chewing well, cutting up food, etc. I have advised EGD/dilation, but she will call when ready. In interim, we will increase Protonix to BID.  Fatty liver: drinks bourbon each night. We discussed avoidance of this to decrease risk of progression to cirrhosis. LFTs most recently normal Oct 2022. No thrombocytopenia.    PLAN:  Surveillance colonoscopy due in 2027 if health permits.  Stop alcohol Increase PPI to BID Recommend EGD/dilation. Patient declining but will call Diet/behavior modification 4 month return  Annitta Needs, PhD, [facility]

## 2022-02-14 IMAGING — MG MM DIGITAL SCREENING BILAT W/ TOMO AND CAD
8 of 14 series · 8 of 40 positions shown · non-contrast
Comparison: Previous exam(s).

CLINICAL DATA: Screening.

EXAM:
DIGITAL SCREENING BILATERAL MAMMOGRAM WITH TOMOSYNTHESIS AND CAD
TECHNIQUE: Bilateral screening digital craniocaudal and mediolateral oblique
mammograms were obtained. Bilateral screening digital breast
tomosynthesis was performed. The images were evaluated with
computer-aided detection.

[L MLO synth-2D (1 of 2)]
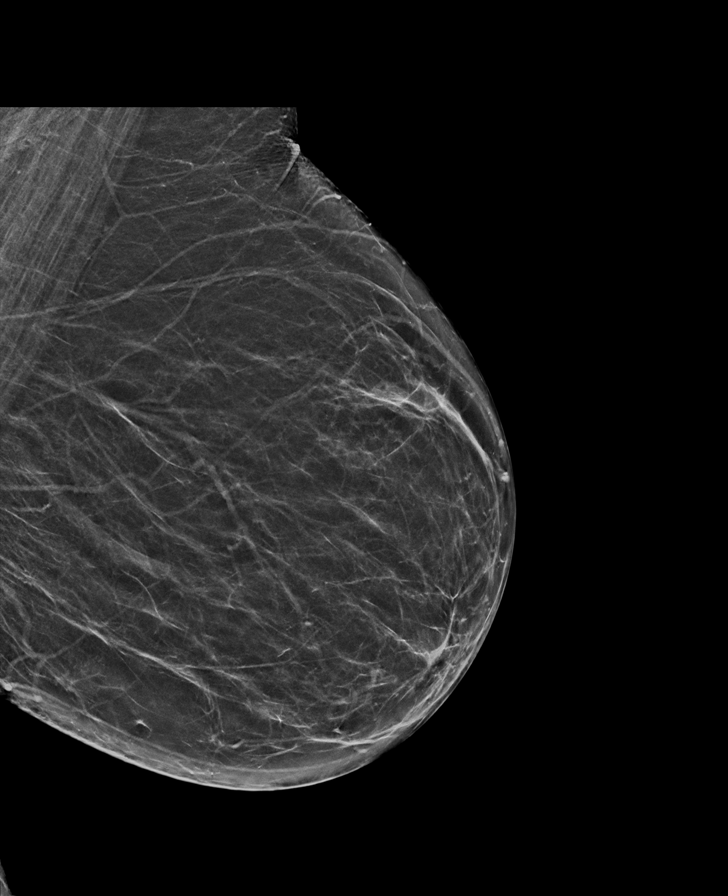

[L CC synth-2D (1 of 2)]
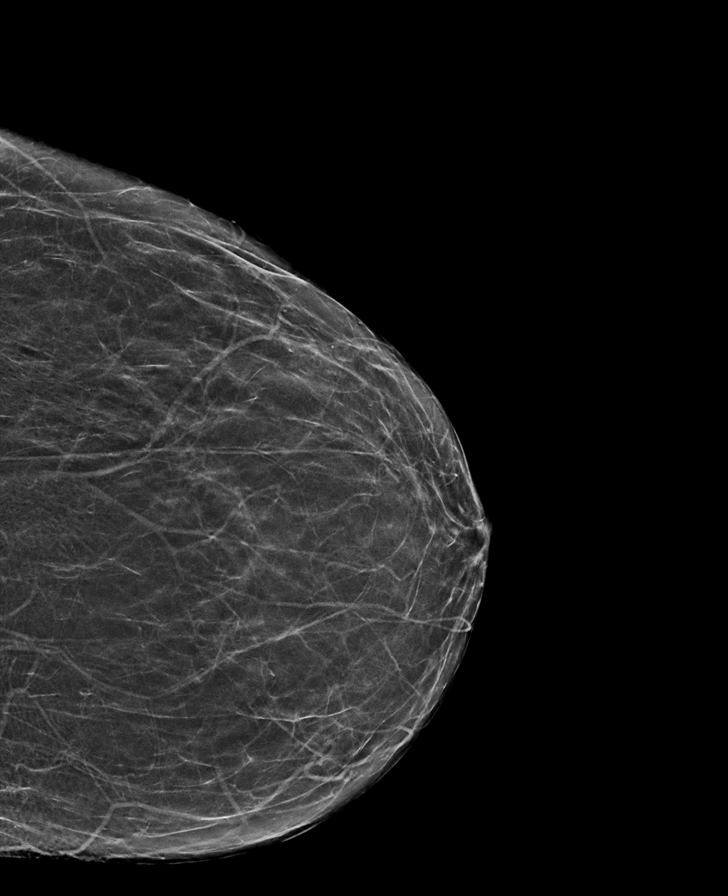

[R CC synth-2D (1 of 2)]
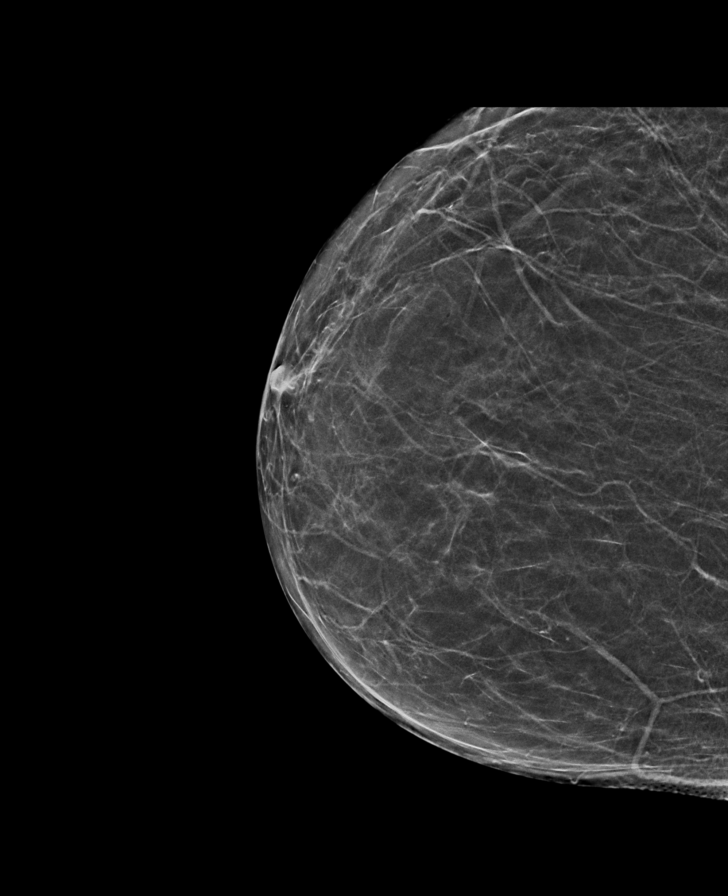

[R CC synth-2D (2 of 2)]
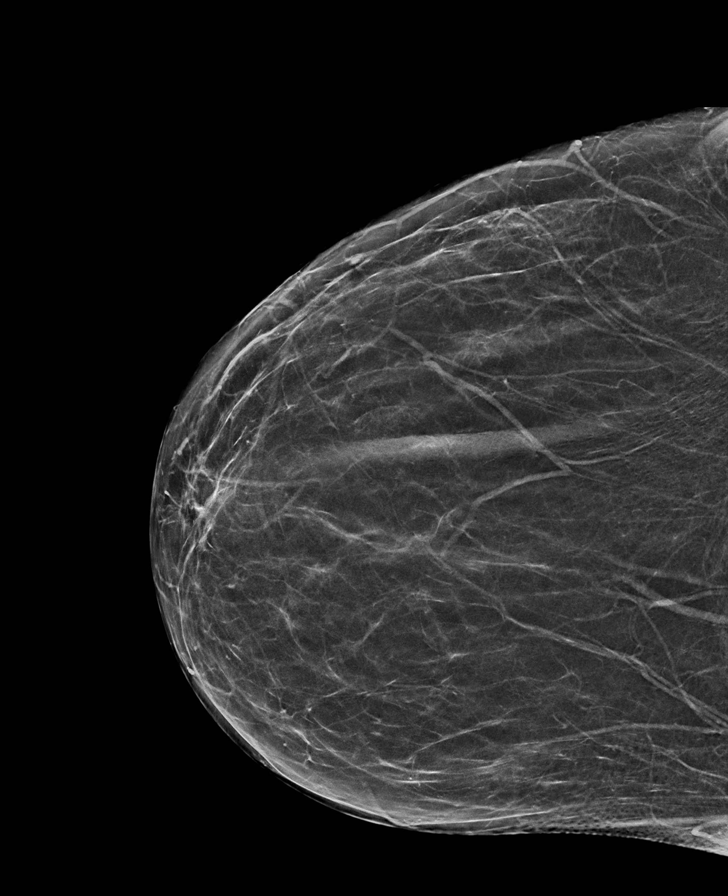

[L MLO synth-2D (2 of 2)]
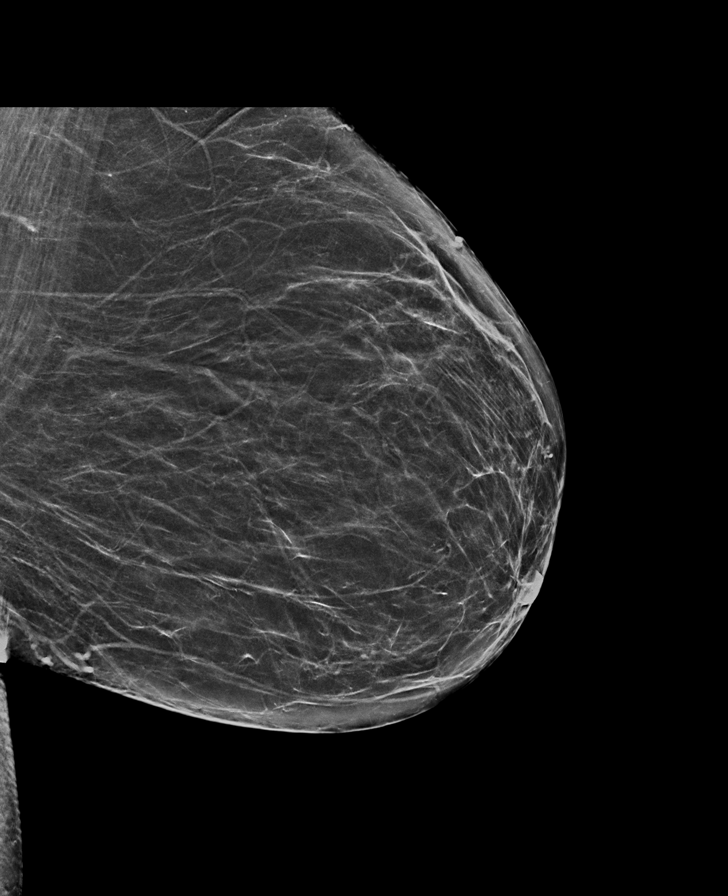

[R MLO synth-2D]
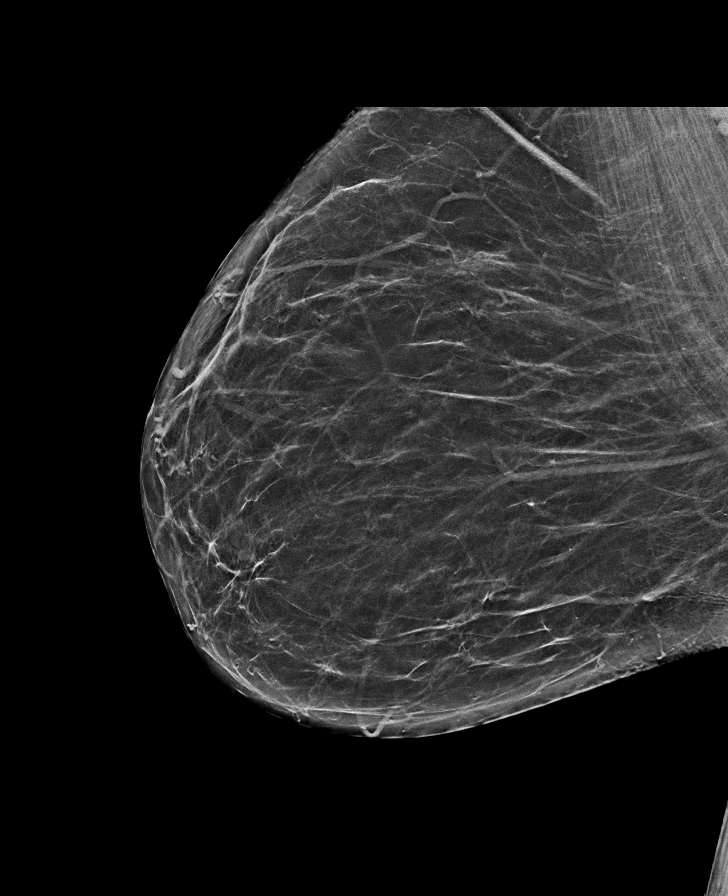

[L CC synth-2D (2 of 2)]
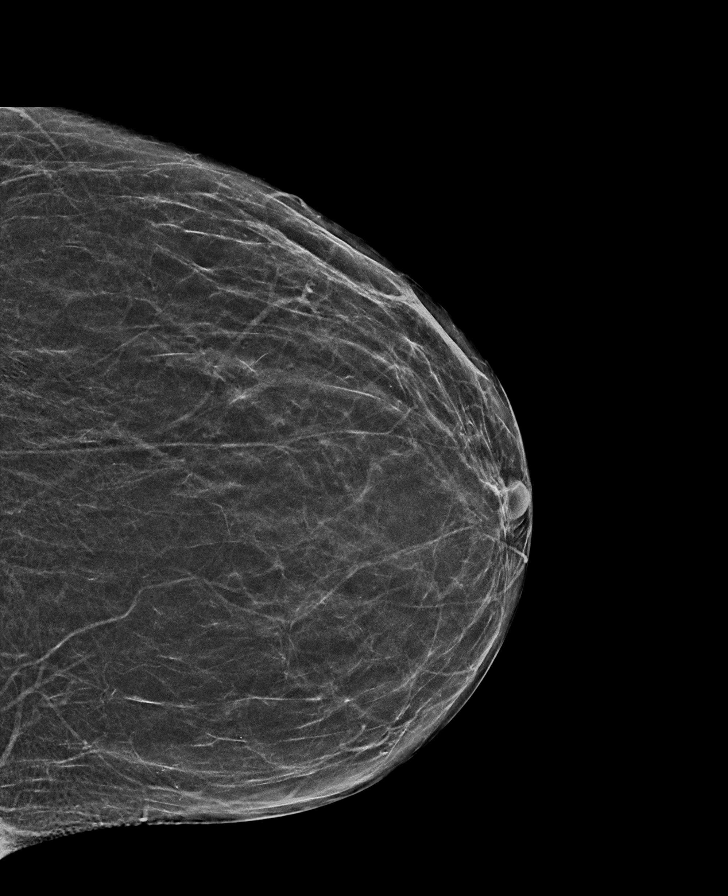

[R CC tomo · tomo slice 30/59.0]
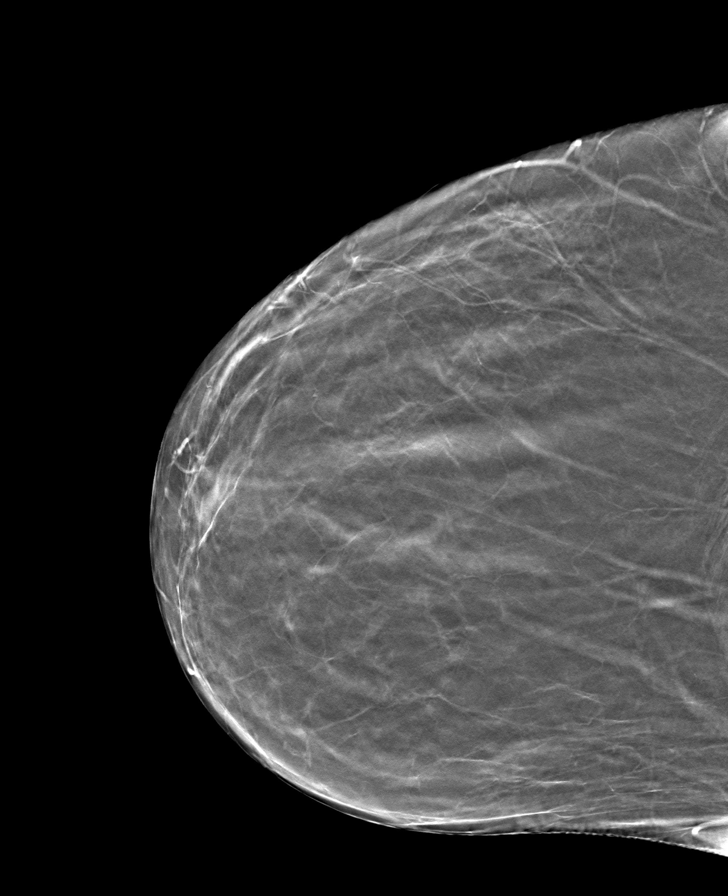

[8 of 40 positions shown; findings below may reference images not displayed]

ACR Breast Density Category b: There are scattered areas of
fibroglandular density.
FINDINGS: There are no findings suspicious for malignancy. The images were
evaluated with computer-aided detection.
IMPRESSION: No mammographic evidence of malignancy. A result letter of this
screening mammogram will be mailed directly to the patient.

RECOMMENDATION:
Screening mammogram in one year. (Code:WJ-[id number])

BI-RADS CATEGORY  1: Negative.

## 2022-03-04 ENCOUNTER — Encounter: Payer: Self-pay | Admitting: Internal Medicine

## 2022-03-13 IMAGING — US US EXTREM LOW VENOUS
1 series · 13 of 24 positions shown · non-contrast
Comparison: No recent prior.

CLINICAL DATA: Bilateral leg edema.



[Series 1: us venous img lower bilat (dvt) · portal-venous · 13 of 75 slices shown]
[im 1/75]
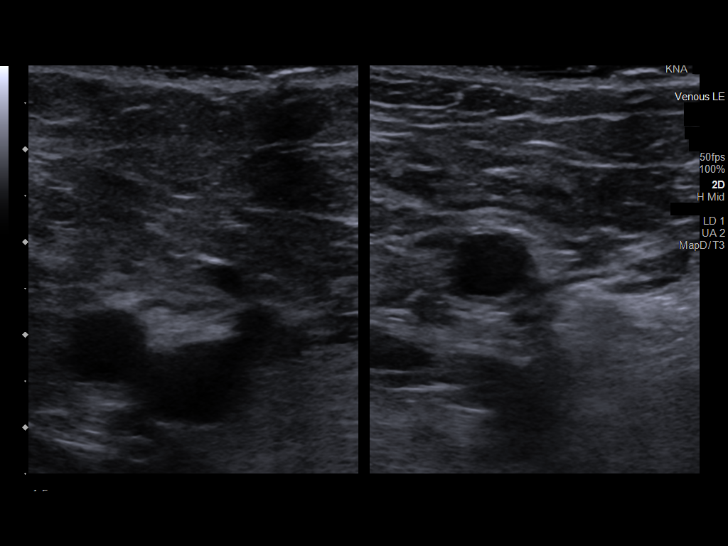
[im 7/75]
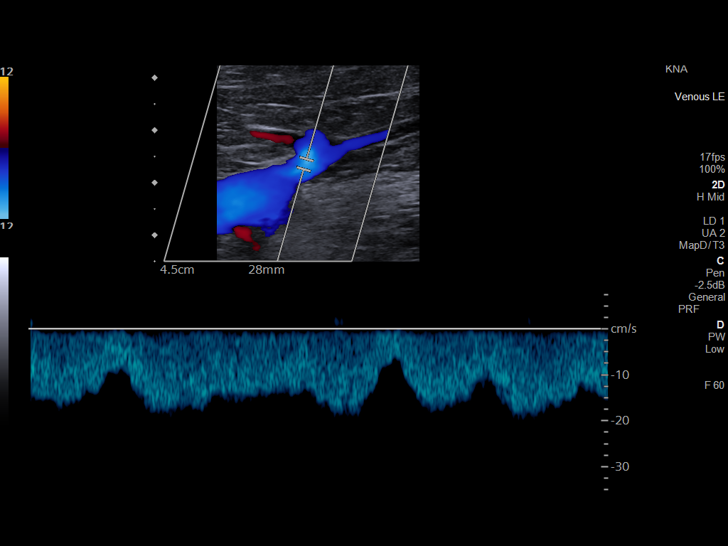
[im 13/75]
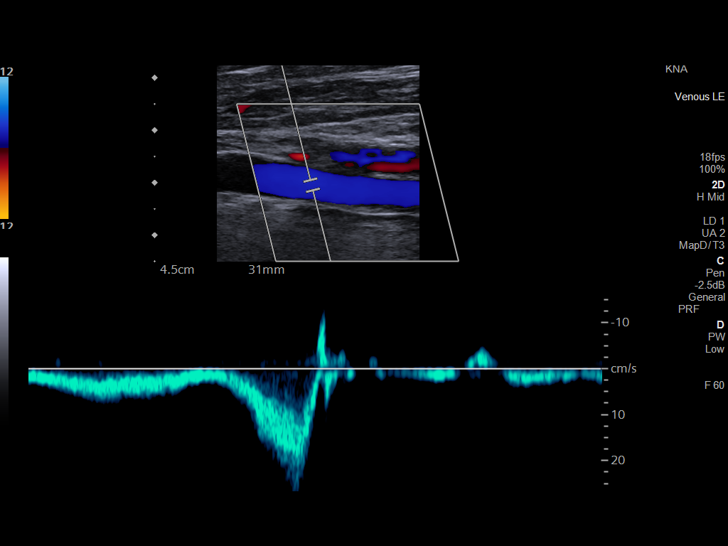
[im 20/75]
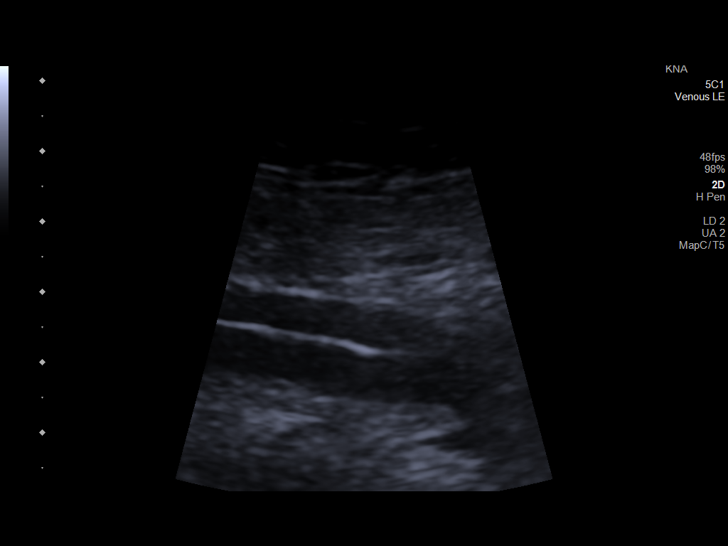
[im 26/75]
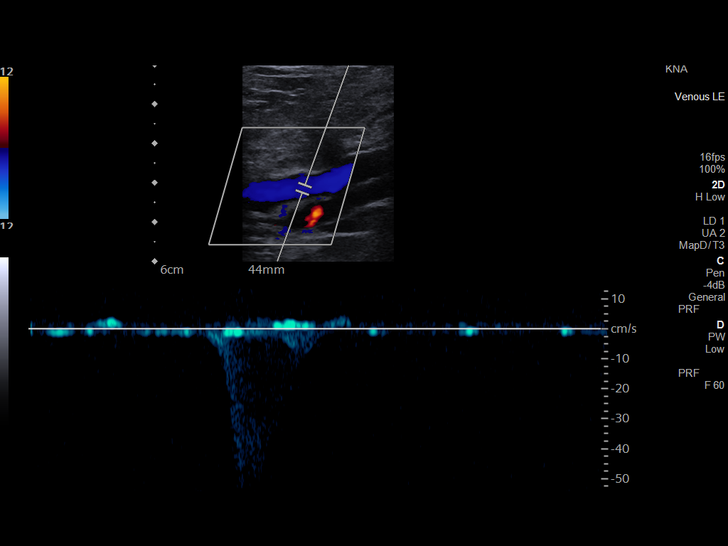
[im 33/75]
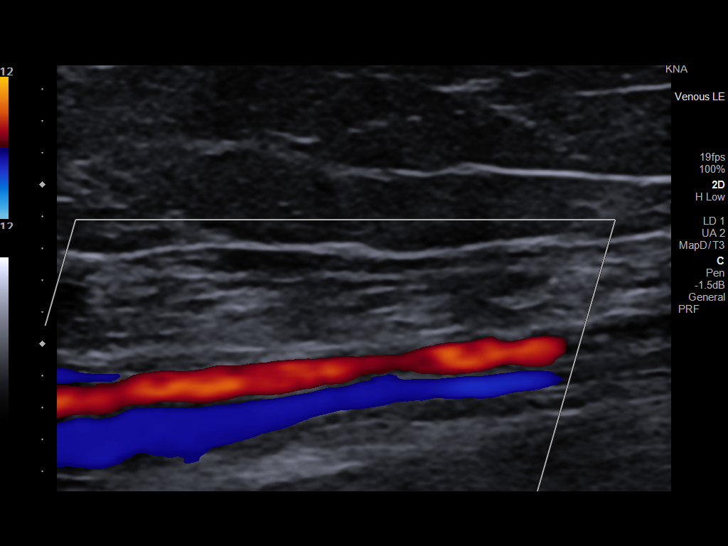
[im 39/75]
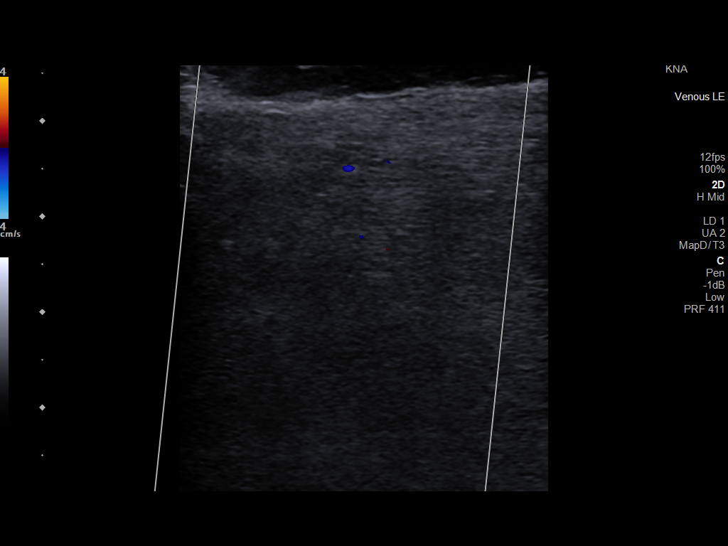
[im 42/75]
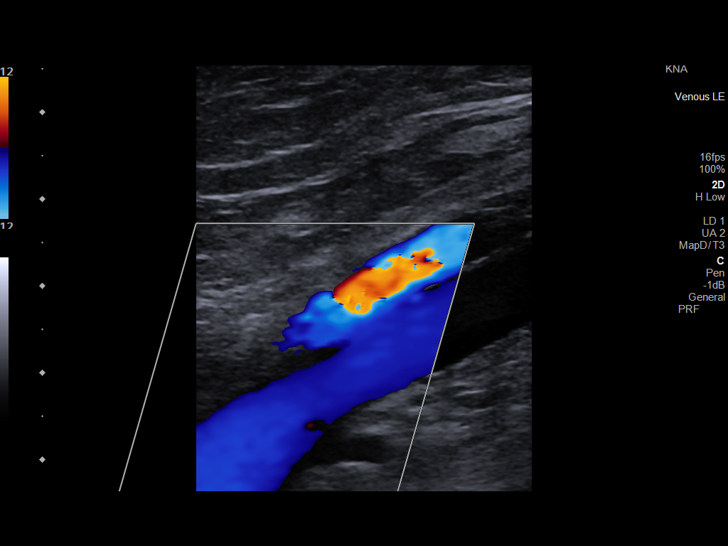
[im 49/75]
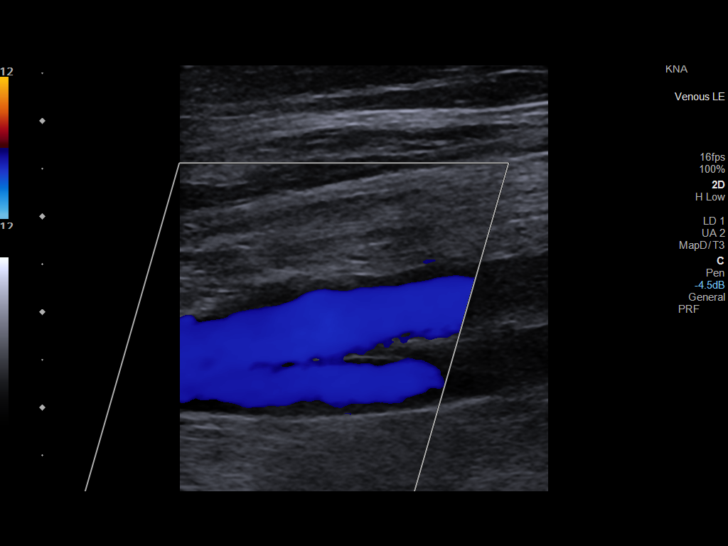
[im 55/75]
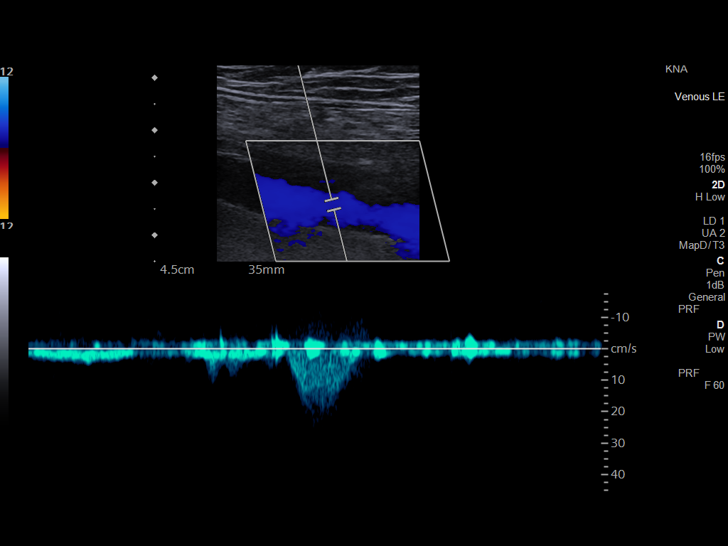
[im 62/75]
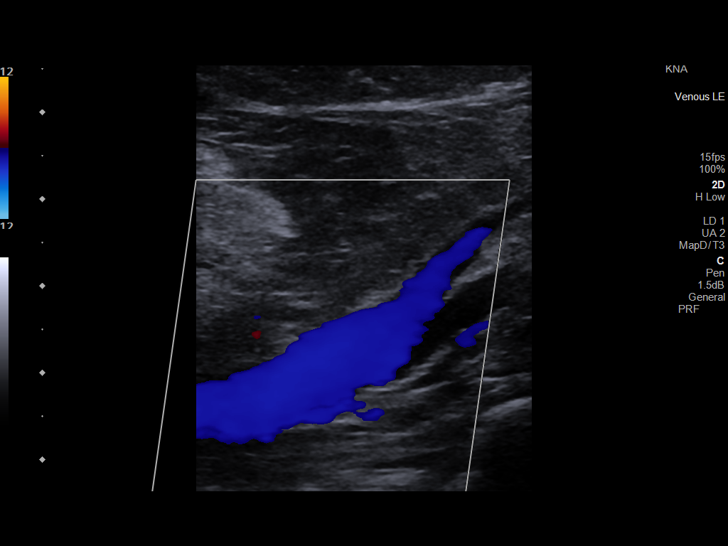
[im 68/75]
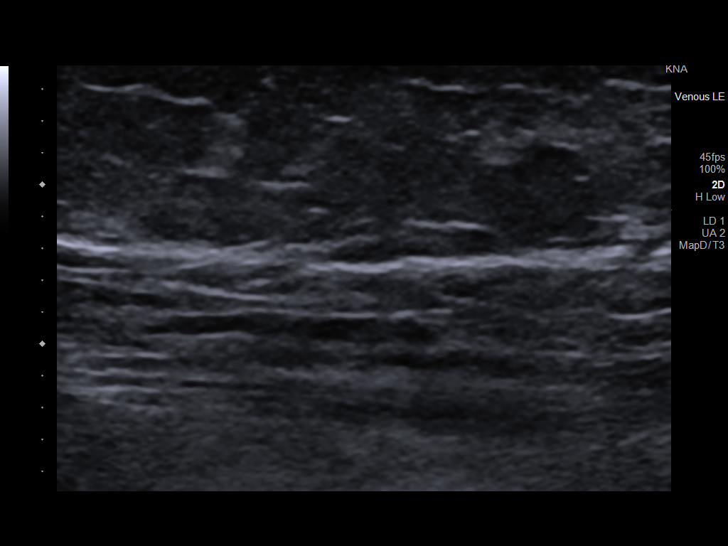
[im 75/75]
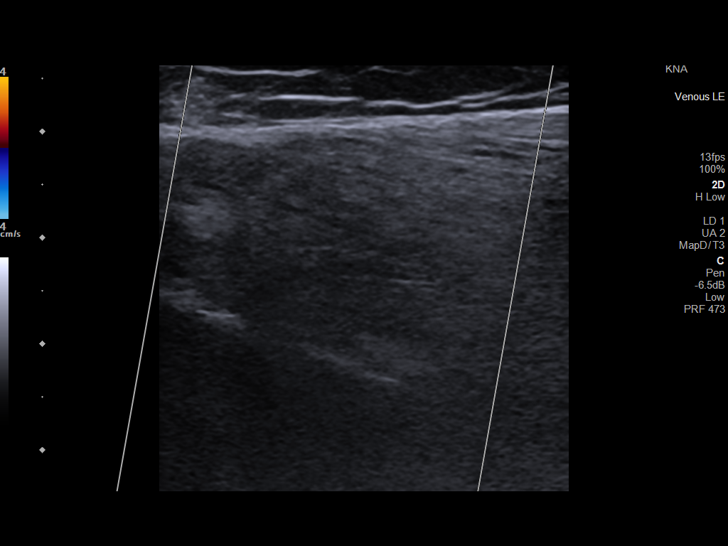

[13 of 24 positions shown; findings below may reference images not displayed]

FINDINGS: RIGHT LOWER EXTREMITY

Common Femoral Vein: No evidence of thrombus. Normal
compressibility, respiratory phasicity and response to augmentation.

Saphenofemoral Junction: No evidence of thrombus. Normal
compressibility and flow on color Doppler imaging.

Profunda Femoral Vein: No evidence of thrombus. Normal
compressibility and flow on color Doppler imaging.

Femoral Vein: No evidence of thrombus. Normal compressibility,
respiratory phasicity and response to augmentation.

Popliteal Vein: No evidence of thrombus. Normal compressibility,
respiratory phasicity and response to augmentation.

Calf Veins: No evidence of thrombus. Normal compressibility and flow
on color Doppler imaging.

Superficial Great Saphenous Vein: No evidence of thrombus. Normal
compressibility.

Other Findings:  Exam limited due to depth of the veins.

LEFT LOWER EXTREMITY

Common Femoral Vein: No evidence of thrombus. Normal
compressibility, respiratory phasicity and response to augmentation.

Saphenofemoral Junction: No evidence of thrombus. Normal
compressibility and flow on color Doppler imaging.

Profunda Femoral Vein: No evidence of thrombus. Normal
compressibility and flow on color Doppler imaging.

Femoral Vein: No evidence of thrombus. Normal compressibility,
respiratory phasicity and response to augmentation.

Popliteal Vein: No evidence of thrombus. Normal compressibility,
respiratory phasicity and response to augmentation.

Calf Veins: No evidence of thrombus. Normal compressibility and flow
on color Doppler imaging.

Superficial Great Saphenous Vein: No evidence of thrombus. Normal
compressibility.

Other Findings:  Exam limited due to depth of veins.
IMPRESSION: No evidence of deep venous thrombosis in either lower extremity.

## 2022-03-25 ENCOUNTER — Encounter: Payer: Self-pay | Admitting: Pulmonary Disease

## 2022-03-25 ENCOUNTER — Ambulatory Visit (INDEPENDENT_AMBULATORY_CARE_PROVIDER_SITE_OTHER): Payer: Medicare Other | Admitting: Pulmonary Disease

## 2022-03-25 ENCOUNTER — Ambulatory Visit: Payer: Medicare Other | Admitting: Pulmonary Disease

## 2022-03-25 VITALS — BP 126/70 | HR 90 | Ht 63.0 in | Wt 202.0 lb

## 2022-03-25 DIAGNOSIS — G4734 Idiopathic sleep related nonobstructive alveolar hypoventilation: Secondary | ICD-10-CM

## 2022-03-25 DIAGNOSIS — J432 Centrilobular emphysema: Secondary | ICD-10-CM | POA: Diagnosis not present

## 2022-03-25 LAB — PULMONARY FUNCTION TEST
DL/VA % pred: 107 %
DL/VA: 4.56 ml/min/mmHg/L
DLCO cor % pred: 103 %
DLCO cor: 20.19 ml/min/mmHg
DLCO unc % pred: 103 %
DLCO unc: 20.19 ml/min/mmHg
FEF 25-75 Post: 1.31 L/sec
FEF 25-75 Pre: 1.18 L/sec
FEF2575-%Change-Post: 10 %
FEF2575-%Pred-Post: 58 %
FEF2575-%Pred-Pre: 52 %
FEV1-%Change-Post: 2 %
FEV1-%Pred-Post: 70 %
FEV1-%Pred-Pre: 68 %
FEV1-Post: 1.71 L
FEV1-Pre: 1.67 L
FEV1FVC-%Change-Post: 2 %
FEV1FVC-%Pred-Pre: 92 %
FEV6-%Change-Post: 0 %
FEV6-%Pred-Post: 76 %
FEV6-%Pred-Pre: 75 %
FEV6-Post: 2.32 L
FEV6-Pre: 2.3 L
FEV6FVC-%Change-Post: 0 %
FEV6FVC-%Pred-Post: 103 %
FEV6FVC-%Pred-Pre: 102 %
FVC-%Change-Post: 0 %
FVC-%Pred-Post: 73 %
FVC-%Pred-Pre: 73 %
FVC-Post: 2.32 L
FVC-Pre: 2.32 L
Post FEV1/FVC ratio: 74 %
Post FEV6/FVC ratio: 100 %
Pre FEV1/FVC ratio: 72 %
Pre FEV6/FVC Ratio: 99 %
RV % pred: 136 %
RV: 2.68 L
TLC % pred: 104 %
TLC: 5.13 L

## 2022-03-25 MED ORDER — ANORO ELLIPTA 62.5-25 MCG/ACT IN AEPB: 1.0000 | INHALATION_SPRAY | Freq: Every day | RESPIRATORY_TRACT | 3 refills | Status: AC

## 2022-03-25 NOTE — Patient Instructions (Signed)
Performed Full PFT Today.   ?

## 2022-03-25 NOTE — Progress Notes (Signed)
Performed Full PFT Today.   ?

## 2022-03-25 NOTE — Patient Instructions (Addendum)
Use nicotine patch 86m daily to help you quit smoking. When you are comfortable with decreasing the dose you can change to a 74mdaily patch.  ?  ?Use nicotine mini lozenges as needed for break through cravings for cigarettes.  ?  ?Use albuterol inhaler as needed for shortness of breath. ? ?Start anoro ellipta inhaler 1 puff daily ? ?Follow up in 1 year ?  ?

## 2022-03-25 NOTE — Progress Notes (Signed)
? ?Synopsis: Referred in 11/2020 for evaluation of COPD and pneumonia by Lars Mage, NP. ? ?Subjective:  ? ?PATIENT ID: Lydia Alexander GENDER: female DOB: [birth year], MRN: [id number] ? ?HPI ? ?Chief Complaint  ?Patient presents with  ? Follow-up  ?  6 mo f/u after PFT. States her breathing has stable since last visit.   ? ?Lydia Alexander is a 61 year old woman, daily smoker with dysphagia, GERD, and centrilobular emphysema who returns to pulmonary clinic for follow up. ? ?She has done well since last visit. She has not needed albuterol inhaler much. She has cut down to 1 pack per day from 2 packs per day. She has cut down on her own. She has patches and lozenges at home.  ? ?No acute complaints today. PFTs show non-specific pulmonary function pattern with air trapping present.  ? ?OV 09/28/21 ?She has done well since last visit with no issues in her breathing. She reports using the spiriva inhaler 1 time as she did not try it on a regular basis. [place] when having shortness of breath and it did provide relief. She has an albuterol inhaler for as needed use. ? ?She continues to smoke 5-15 cigarettes daily. She is enrolled in our lung cancer screening program. Last scan was 09/06/21, Lung RADS 2.  ? ?She does have nocturnal oxygen at home but has not been using it over the past month due to complaints of dry nasal passages.  ? ?She is accompanied by her son today.  ? ?Past Medical History:  ?Diagnosis Date  ? Anxiety   ? Back pain   ? COPD (chronic obstructive pulmonary disease) ([place])   ? GERD (gastroesophageal reflux disease)   ? Mixed hyperlipidemia   ? Spinal stenosis   ?  ? ?Family History  ?Problem Relation Age of Onset  ? Rectal cancer Father 31  ?  ? ?Social History  ? ?Socioeconomic History  ? Marital status: Widowed  ?  Spouse name: Not on file  ? Number of children: Not on file  ? Years of education: Not on file  ? Highest education level: Not on file  ?Occupational History  ? Not on  file  ?Tobacco Use  ? Smoking status: Every Day  ?  Packs/day: 1.00  ?  Years: 40.00  ?  Pack years: 40.00  ?  Types: Cigarettes  ? Smokeless tobacco: Never  ? Tobacco comments:  ?  smoking .75 pack daily-AH/01/01/2021  ?Vaping Use  ? Vaping Use: Never used  ?Substance and Sexual Activity  ? Alcohol use: Yes  ?  Comment: 1-2 shots burbon at night  ? Drug use: Never  ? Sexual activity: Not Currently  ?Other Topics Concern  ? Not on file  ?Social History Narrative  ? Not on file  ? ?Social Determinants of Health  ? ?Financial Resource Strain: Not on file  ?Food Insecurity: Not on file  ?Transportation Needs: Not on file  ?Physical Activity: Not on file  ?Stress: Not on file  ?Social Connections: Not on file  ?Intimate Partner Violence: Not on file  ?  ? ?No Known Allergies  ? ?Outpatient Medications Prior to Visit  ?Medication Sig Dispense Refill  ? acetaminophen (TYLENOL) 650 MG CR tablet Take 1,300 mg by mouth as needed for pain.    ? albuterol (VENTOLIN HFA) 108 (90 Base) MCG/ACT inhaler Inhale 2 puffs into the lungs every 6 (six) hours as needed for wheezing or shortness of breath. 8 g  2  ? ALPRAZolam (XANAX) 1 MG tablet Take 1 tablet by mouth 2 (two) times daily.    ? atorvastatin (LIPITOR) 10 MG tablet Take 10 mg by mouth at bedtime.    ? buPROPion (WELLBUTRIN) 75 MG tablet Not started yet 11/27/21    ? cyanocobalamin 2000 MCG tablet Take 500 mcg by mouth at bedtime.    ? cyclobenzaprine (FLEXERIL) 10 MG tablet Take 10 mg by mouth 2 times daily at 12 noon and 4 pm.    ? diphenhydrAMINE (BENADRYL) 25 MG tablet Take 25 mg by mouth at bedtime as needed for sleep.    ? furosemide (LASIX) 20 MG tablet Take 1 tablet (20 mg total) by mouth daily as needed. (Patient taking differently: Take 20 mg by mouth daily as needed for edema.) 30 tablet 0  ? gabapentin (NEURONTIN) 600 MG tablet gabapentin 600 mg tablet ? Take 1 tablet 3 times a day by oral route.    ? nystatin cream (MYCOSTATIN) APPLY TO AFFECTED AREA TWICE A DAY     ? pantoprazole (PROTONIX) 40 MG tablet Take 1 tablet (40 mg total) by mouth 2 (two) times daily before a meal. 60 tablet 5  ? potassium chloride (KLOR-CON) 10 MEQ tablet Take 10 mEq by mouth daily as needed.    ? Vitamin D, Ergocalciferol, (DRISDOL) 1.25 MG (50000 UNIT) CAPS capsule Take 50,000 Units by mouth every Tuesday.    ? Tiotropium Bromide Monohydrate (SPIRIVA RESPIMAT) 2.5 MCG/ACT AERS Inhale into the lungs.    ? ?No facility-administered medications prior to visit.  ? ? ?Review of Systems  ?Constitutional:  Negative for chills, fever, malaise/fatigue and weight loss.  ?HENT:  Negative for congestion, sinus pain and sore throat.   ?Eyes: Negative.   ?Respiratory:  Positive for shortness of breath (with exertion). Negative for cough, hemoptysis, sputum production and wheezing.   ?Cardiovascular:  Negative for chest pain, palpitations, orthopnea, claudication and leg swelling.  ?Gastrointestinal:  Negative for abdominal pain, heartburn, nausea and vomiting.  ?Genitourinary: Negative.   ?Musculoskeletal: Negative.   ?Skin:  Negative for rash.  ?Neurological:  Negative for dizziness, weakness and headaches.  ?Endo/Heme/Allergies: Negative.   ?Psychiatric/Behavioral: Negative.    ? ?Objective:  ? ?Vitals:  ? 03/25/22 1601  ?BP: 126/70  ?Pulse: 90  ?SpO2: 95%  ?Weight: 202 lb (91.6 kg)  ?Height: 5' 3"  (1.6 m)  ? ? ?Physical Exam ?Constitutional:   ?   General: She is not in acute distress. ?   Appearance: She is obese.  ?HENT:  ?   Head: Normocephalic and atraumatic.  ?   Mouth/Throat:  ?   Mouth: Mucous membranes are moist.  ?   Pharynx: Oropharynx is clear.  ?Eyes:  ?   General: No scleral icterus. ?   Conjunctiva/sclera: Conjunctivae normal.  ?Cardiovascular:  ?   Rate and Rhythm: Normal rate and regular rhythm.  ?   Pulses: Normal pulses.  ?   Heart sounds: Normal heart sounds. No murmur heard. ?Pulmonary:  ?   Effort: Pulmonary effort is normal.  ?   Breath sounds: Decreased air movement present. No  wheezing, rhonchi or rales.  ?Musculoskeletal:  ?   Right lower leg: No edema.  ?   Left lower leg: No edema.  ?Skin: ?   General: Skin is warm and dry.  ?   Capillary Refill: Capillary refill takes less than 2 seconds.  ?Neurological:  ?   General: No focal deficit present.  ?   Mental Status: She  is alert.  ? ? ?CBC ?   ?Component Value Date/Time  ? WBC 6.8 03/09/2021 1131  ? RBC 3.99 03/09/2021 1131  ? HGB 13.1 03/09/2021 1131  ? HCT 42.4 03/09/2021 1131  ? PLT 188 03/09/2021 1131  ? MCV 106.3 (H) 03/09/2021 1131  ? MCH 32.8 03/09/2021 1131  ? MCHC 30.9 03/09/2021 1131  ? RDW 13.4 03/09/2021 1131  ? LYMPHSABS 2.0 02/09/2021 0028  ? MONOABS 0.6 02/09/2021 0028  ? EOSABS 0.1 02/09/2021 0028  ? BASOSABS 0.1 02/09/2021 0028  ? ? ?  Latest Ref Rng & Units 02/09/2021  ? 12:28 AM 12/05/2020  ? 12:19 PM 08/03/2020  ?  6:15 AM  ?BMP  ?Glucose 70 - 99 mg/dL 108   129   109    ?BUN 6 - 20 mg/dL 9   15   10     ?Creatinine 0.44 - 1.00 mg/dL 0.82   0.73   0.62    ?Sodium 135 - 145 mmol/L 137   139   140    ?Potassium 3.5 - 5.1 mmol/L 4.0   3.4   4.1    ?Chloride 98 - 111 mmol/L 102   103   105    ?CO2 22 - 32 mmol/L 27   28   29     ?Calcium 8.9 - 10.3 mg/dL 8.5   8.7   8.1    ? ?Chest imaging: ?CT Chest LCS 09/06/21 ?1. Lung-RADS 2, benign appearance or behavior. Continue annual ?screening with low-dose chest CT without contrast in 12 months. ?2. Coronary artery calcifications. ?3. Aortic Atherosclerosis ? ?CXR 09/26/20 ?1. No evidence of acute cardiopulmonary disease. ?2. Nodular density along the LEFT heart border of uncertain ?significance. This could represent scarring or residual airspace ?disease from reported recent pneumonia. Suggest 4-6 week follow-up ?to ensure resolution and exclude underlying nodule. ? ?CXR 08/02/20 ?Prominence of the cardiomediastinal silhouette may be secondary to ?mild hypoinflation and AP technique. Patchy left basilar opacities. ?No pneumothorax or pleural effusion. No acute osseous  abnormality. ? ?PFT: ? ?  Latest Ref Rng & Units 03/25/2022  ?  2:43 PM  ?PFT Results  ?FVC-Pre L 2.32  P  ?FVC-Predicted Pre % 73  P  ?FVC-Post L 2.32  P  ?FVC-Predicted Post % 73  P  ?Pre FEV1/FVC % % 72  P  ?Post FEV1/FC

## 2022-07-05 ENCOUNTER — Other Ambulatory Visit: Payer: Self-pay | Admitting: Gastroenterology

## 2022-07-05 DIAGNOSIS — K3189 Other diseases of stomach and duodenum: Secondary | ICD-10-CM

## 2022-07-05 DIAGNOSIS — R1319 Other dysphagia: Secondary | ICD-10-CM

## 2022-07-12 DEATH — deceased

## 2022-09-09 ENCOUNTER — Ambulatory Visit (HOSPITAL_COMMUNITY): Payer: Medicare Other
